# Patient Record
Sex: Female | Born: 1935 | Race: White | Hispanic: No | Smoking: Former smoker
Health system: Southern US, Community
[De-identification: ages and names within clinical notes are randomized; demographics above are authoritative.]

## PROBLEM LIST (undated history)

## (undated) DIAGNOSIS — I10 Essential (primary) hypertension: Secondary | ICD-10-CM

## (undated) DIAGNOSIS — M479 Spondylosis, unspecified: Secondary | ICD-10-CM

## (undated) DIAGNOSIS — R9431 Abnormal electrocardiogram [ECG] [EKG]: Secondary | ICD-10-CM

## (undated) DIAGNOSIS — R233 Spontaneous ecchymoses: Secondary | ICD-10-CM

## (undated) DIAGNOSIS — M810 Age-related osteoporosis without current pathological fracture: Secondary | ICD-10-CM

## (undated) DIAGNOSIS — C50919 Malignant neoplasm of unspecified site of unspecified female breast: Secondary | ICD-10-CM

## (undated) DIAGNOSIS — Z139 Encounter for screening, unspecified: Secondary | ICD-10-CM

## (undated) DIAGNOSIS — R079 Chest pain, unspecified: Secondary | ICD-10-CM

## (undated) DIAGNOSIS — E119 Type 2 diabetes mellitus without complications: Secondary | ICD-10-CM

## (undated) DIAGNOSIS — Z87891 Personal history of nicotine dependence: Secondary | ICD-10-CM

## (undated) DIAGNOSIS — J3089 Other allergic rhinitis: Secondary | ICD-10-CM

## (undated) DIAGNOSIS — E785 Hyperlipidemia, unspecified: Secondary | ICD-10-CM

## (undated) DIAGNOSIS — M1990 Unspecified osteoarthritis, unspecified site: Secondary | ICD-10-CM

## (undated) DIAGNOSIS — F0390 Unspecified dementia without behavioral disturbance: Secondary | ICD-10-CM

## (undated) HISTORY — DX: Other allergic rhinitis: J30.89

## (undated) HISTORY — DX: Spondylosis, unspecified: M47.9

## (undated) HISTORY — DX: Unspecified osteoarthritis, unspecified site: M19.90

## (undated) HISTORY — DX: Spontaneous ecchymoses: R23.3

## (undated) HISTORY — DX: Malignant neoplasm of unspecified site of unspecified female breast: C50.919

## (undated) HISTORY — DX: Type 2 diabetes mellitus without complications: E11.9

## (undated) HISTORY — PX: NEPHRECTOMY: SHX65

## (undated) HISTORY — PX: BREAST BIOPSY: SHX20

## (undated) HISTORY — DX: Hyperlipidemia, unspecified: E78.5

## (undated) HISTORY — DX: Chest pain, unspecified: R07.9

## (undated) HISTORY — DX: Essential (primary) hypertension: I10

## (undated) HISTORY — PX: TOTAL ABDOMINAL HYSTERECTOMY: SHX209

## (undated) HISTORY — DX: Abnormal electrocardiogram (ECG) (EKG): R94.31

## (undated) HISTORY — DX: Personal history of nicotine dependence: Z87.891

## (undated) HISTORY — DX: Encounter for screening, unspecified: Z13.9

## (undated) HISTORY — DX: Age-related osteoporosis without current pathological fracture: M81.0

## (undated) HISTORY — PX: CATARACT EXTRACTION, BILATERAL: SHX1313

## (undated) HISTORY — PX: MASTECTOMY: SHX3

---

## 2004-11-23 ENCOUNTER — Ambulatory Visit: Payer: Self-pay | Admitting: Internal Medicine

## 2005-11-22 ENCOUNTER — Ambulatory Visit: Payer: Self-pay | Admitting: Internal Medicine

## 2006-06-10 ENCOUNTER — Emergency Department: Payer: Self-pay | Admitting: Emergency Medicine

## 2006-06-10 ENCOUNTER — Other Ambulatory Visit: Payer: Self-pay

## 2006-12-06 ENCOUNTER — Ambulatory Visit: Payer: Self-pay | Admitting: Family Medicine

## 2007-11-28 ENCOUNTER — Ambulatory Visit: Payer: Self-pay | Admitting: Nurse Practitioner

## 2009-05-06 ENCOUNTER — Ambulatory Visit: Payer: Self-pay | Admitting: Nurse Practitioner

## 2010-04-05 ENCOUNTER — Ambulatory Visit: Payer: Self-pay | Admitting: Family Medicine

## 2010-05-10 ENCOUNTER — Ambulatory Visit: Payer: Self-pay | Admitting: Family Medicine

## 2011-05-30 ENCOUNTER — Ambulatory Visit: Payer: Self-pay | Admitting: Family Medicine

## 2011-09-20 ENCOUNTER — Ambulatory Visit: Payer: Self-pay | Admitting: Unknown Physician Specialty

## 2012-05-27 ENCOUNTER — Emergency Department: Payer: Self-pay | Admitting: Emergency Medicine

## 2012-05-27 LAB — COMPREHENSIVE METABOLIC PANEL
BUN: 15 mg/dL (ref 7–18)
Calcium, Total: 9 mg/dL (ref 8.5–10.1)
Chloride: 110 mmol/L — ABNORMAL HIGH (ref 98–107)
Creatinine: 1.09 mg/dL (ref 0.60–1.30)
EGFR (Non-African Amer.): 49 — ABNORMAL LOW

## 2012-05-27 LAB — CBC
HGB: 14.3 g/dL (ref 12.0–16.0)
MCH: 30.6 pg (ref 26.0–34.0)
MCHC: 32.6 g/dL (ref 32.0–36.0)
MCV: 94 fL (ref 80–100)
RDW: 13.4 % (ref 11.5–14.5)
WBC: 6 10*3/uL (ref 3.6–11.0)

## 2012-05-27 LAB — TROPONIN I
Troponin-I: <0.02 ng/mL
Troponin-I: <0.02 ng/mL

## 2012-05-28 ENCOUNTER — Ambulatory Visit (INDEPENDENT_AMBULATORY_CARE_PROVIDER_SITE_OTHER): Payer: Medicare PPO | Admitting: Cardiovascular Disease

## 2012-05-28 ENCOUNTER — Encounter: Payer: Self-pay | Admitting: Cardiovascular Disease

## 2012-05-28 VITALS — BP 138/90 | HR 76 | Ht 61.0 in | Wt 116.5 lb

## 2012-05-28 DIAGNOSIS — R079 Chest pain, unspecified: Secondary | ICD-10-CM

## 2012-05-28 DIAGNOSIS — I10 Essential (primary) hypertension: Secondary | ICD-10-CM | POA: Insufficient documentation

## 2012-05-28 DIAGNOSIS — I739 Peripheral vascular disease, unspecified: Secondary | ICD-10-CM | POA: Insufficient documentation

## 2012-05-28 NOTE — Assessment & Plan Note (Signed)
She describes exertional back and thigh discomfort which could be due to her known history of degenerative disc disease. However, she is slightly diminished femoral pulses and thus I will obtain an ABI for evaluation.

## 2012-05-28 NOTE — Assessment & Plan Note (Signed)
Her blood pressure is slightly elevated but will monitor this during her followup.

## 2012-05-28 NOTE — Assessment & Plan Note (Signed)
Recurrent chest pain is worrisome for mild angina. It could be also triggered by stress and anxiety. Given her multiple risk factors for coronary artery disease, I recommend further evaluation with a pharmacologic nuclear stress test. The patient is not able to exercise on a treadmill due to chronic back pain with significant arthritis.

## 2012-05-28 NOTE — Patient Instructions (Addendum)
Your physician has requested that you have a lexiscan myoview. For further information please visit [email or web address] Please follow instruction sheet, as given.  Your physician has requested that you have an ankle brachial index (ABI). During this test an ultrasound and blood pressure cuff are used to evaluate the arteries that supply the arms and legs with blood. Allow thirty minutes for this exam. There are no restrictions or special instructions.  Follow up after tests.

## 2012-05-28 NOTE — Progress Notes (Signed)
Primary care physician: Dr. Otho Darner.   HPI  This is a pleasant 79 year old female who was referred by Dr. Mayford Knife from the emergency room at [facility] for evaluation of chest pain. The patient does not have any previous cardiac history. She has known history of hypertension, hyperlipidemia and possible diabetes but not on medication. She also suffers from chronic back pain with degenerative disc disease diagnosed on MRI. She has been under stress lately related to taking care of her sick sister who is 74 year old. She started experiencing substernal chest tightness radiating to her neck over the last week. This happens with physical activities and mental stress. It lasts for a few minutes. It worsened yesterday and was instructed to go to the emergency room by her primary care physician. Her cardiac enzymes were negative. ECG did not show any acute changes. Chest x-ray showed no acute abnormalities. She was discharged home for outpatient followup. She is feeling better today. She complains of significant back discomfort radiating down to her legs with walking. She is not a smoker. There is no family history of premature coronary artery disease.  No Known Allergies   No current outpatient prescriptions on file prior to visit.   No current facility-administered medications on file prior to visit.     Past Medical History  Diagnosis Date  . Hyperlipidemia   . Arthritis   . Degenerative joint disease of low back   . Spontaneous ecchymoses   . Allergic rhinitis due to other allergen   . Type II or unspecified type diabetes mellitus without mention of complication, not stated as uncontrolled   . Osteoporosis, unspecified   . Other and unspecified hyperlipidemia   . Malignant neoplasm of breast (female), unspecified site   . Hypertension      Past Surgical History  Procedure Laterality Date  . Nephrectomy    . Breast biopsy Right   . Total abdominal hysterectomy    . Cataract extraction,  bilateral       History reviewed. No pertinent family history.   History   Social History  . Marital Status: Widowed    Spouse Name: N/A    Number of Children: N/A  . Years of Education: N/A   Occupational History  . Not on file.   Social History Main Topics  . Smoking status: Former Smoker -- 0 years    Types: Cigarettes  . Smokeless tobacco: Not on file  . Alcohol Use: No  . Drug Use: No  . Sexually Active: Not on file   Other Topics Concern  . Not on file   Social History Narrative  . No narrative on file     ROS Constitutional: Negative for fever, chills, diaphoresis, activity change, appetite change and fatigue.  HENT: Negative for hearing loss, nosebleeds, congestion, sore throat, facial swelling, drooling, trouble swallowing, neck pain, voice change, sinus pressure and tinnitus.  Eyes: Negative for photophobia, pain, discharge and visual disturbance.  Respiratory: Negative for apnea, cough and wheezing.  Cardiovascular: Negative for  palpitations and leg swelling.  Gastrointestinal: Negative for nausea, vomiting, abdominal pain, diarrhea, constipation, blood in stool and abdominal distention.  Genitourinary: Negative for dysuria, urgency, frequency, hematuria and decreased urine volume.  Musculoskeletal: Negative for myalgias, back pain, joint swelling, arthralgias and gait problem.  Skin: Negative for color change, pallor, rash and wound.  Neurological: Negative for dizziness, tremors, seizures, syncope, speech difficulty, weakness, light-headedness, numbness and headaches.  Psychiatric/Behavioral: Negative for suicidal ideas, hallucinations, behavioral problems and agitation. The patient is  nervous/anxious.     PHYSICAL EXAM   BP 138/90  Pulse 76  Ht 5\' 1"  (1.549 m)  Wt 116 lb 8 oz (52.844 kg)  BMI 22.02 kg/m2 Constitutional: She is oriented to person, place, and time. She appears well-developed and well-nourished. No distress.  HENT: No nasal  discharge.  Head: Normocephalic and atraumatic.  Eyes: Pupils are equal and round. Right eye exhibits no discharge. Left eye exhibits no discharge.  Neck: Normal range of motion. Neck supple. No JVD present. No thyromegaly present.  Cardiovascular: Normal rate, regular rhythm, normal heart sounds. Exam reveals no gallop and no friction rub. No murmur heard.  Pulmonary/Chest: Effort normal and breath sounds normal. No stridor. No respiratory distress. She has no wheezes. She has no rales. She exhibits no tenderness.  Abdominal: Soft. Bowel sounds are normal. She exhibits no distension. There is no tenderness. There is no rebound and no guarding.  Musculoskeletal: Normal range of motion. She exhibits no edema and no tenderness.  Neurological: She is alert and oriented to person, place, and time. Coordination normal.  Skin: Skin is warm and dry. No rash noted. She is not diaphoretic. No erythema. No pallor.  Psychiatric: She has a normal mood and affect. Her behavior is normal. Judgment and thought content normal.  Vascular: Normal radial pulse bilaterally. Femoral pulses are slightly diminished bilaterally.    EKG: An EKG from yesterday was reviewed which showed normal sinus rhythm with low voltage. No significant ST or T wave changes.   ASSESSMENT AND PLAN

## 2012-06-03 ENCOUNTER — Ambulatory Visit: Payer: Self-pay | Admitting: Cardiovascular Disease

## 2012-06-03 DIAGNOSIS — R079 Chest pain, unspecified: Secondary | ICD-10-CM

## 2012-06-04 ENCOUNTER — Other Ambulatory Visit: Payer: Self-pay

## 2012-06-04 DIAGNOSIS — R079 Chest pain, unspecified: Secondary | ICD-10-CM

## 2012-06-05 ENCOUNTER — Encounter (INDEPENDENT_AMBULATORY_CARE_PROVIDER_SITE_OTHER): Payer: Medicare PPO

## 2012-06-05 ENCOUNTER — Telehealth: Payer: Self-pay

## 2012-06-05 DIAGNOSIS — I739 Peripheral vascular disease, unspecified: Secondary | ICD-10-CM

## 2012-06-05 NOTE — Progress Notes (Signed)
lmtcb

## 2012-06-05 NOTE — Telephone Encounter (Signed)
Pt was inquiring about her stress test results from Monday,she was in today for her ABI, states she is worried and doesn't want to wait until 5/13 to get the results, but she can if she needs to. Please call

## 2012-06-05 NOTE — Telephone Encounter (Signed)
Pt informed

## 2012-06-10 NOTE — Progress Notes (Signed)
Pt informed of normal ABI results.

## 2012-06-25 ENCOUNTER — Ambulatory Visit (INDEPENDENT_AMBULATORY_CARE_PROVIDER_SITE_OTHER): Payer: Medicare PPO | Admitting: Cardiovascular Disease

## 2012-06-25 VITALS — BP 113/71 | HR 106 | Ht 61.5 in | Wt 115.0 lb

## 2012-06-25 DIAGNOSIS — I739 Peripheral vascular disease, unspecified: Secondary | ICD-10-CM

## 2012-06-25 DIAGNOSIS — R079 Chest pain, unspecified: Secondary | ICD-10-CM

## 2012-06-25 NOTE — Patient Instructions (Addendum)
Your stress test and circulation test were normal.  Follow up as needed.

## 2012-06-25 NOTE — Assessment & Plan Note (Signed)
This seems to be due to have chronic back pain with neuropathy. ABI was normal.

## 2012-06-25 NOTE — Progress Notes (Signed)
Primary care physician: Dr. Otho Darner.   HPI  This is a pleasant 79 year old female who is here today for followup visit regarding chest pain. She has known history of hypertension, hyperlipidemia and possible diabetes but not on medication. She also suffers from chronic back pain with degenerative disc disease diagnosed on MRI. She has been under stress lately related to taking care of her sick sister who is 88 year old.  She was seen recently for atypical chest pain. Basic workup in the emergency room was unremarkable. She underwent a pharmacologic nuclear stress test which was normal. She also had an ABI done due to lower extremity discomfort. This also was normal. She reports no further chest pain.  No Known Allergies   Current Outpatient Prescriptions on File Prior to Visit  Medication Sig Dispense Refill  . Calcium Carbonate (CALTRATE 600 PO) Take by mouth daily.      Marland Kitchen lovastatin (MEVACOR) 40 MG tablet Take 40 mg by mouth at bedtime.      . metoprolol succinate (TOPROL-XL) 25 MG 24 hr tablet Take 25 mg by mouth daily.      Marland Kitchen omeprazole (PRILOSEC) 10 MG capsule Take 10 mg by mouth daily.       No current facility-administered medications on file prior to visit.     Past Medical History  Diagnosis Date  . Hyperlipidemia   . Arthritis   . Degenerative joint disease of low back   . Spontaneous ecchymoses   . Allergic rhinitis due to other allergen   . Type II or unspecified type diabetes mellitus without mention of complication, not stated as uncontrolled   . Osteoporosis, unspecified   . Other and unspecified hyperlipidemia   . Malignant neoplasm of breast (female), unspecified site   . Hypertension      Past Surgical History  Procedure Laterality Date  . Nephrectomy    . Breast biopsy Right   . Total abdominal hysterectomy    . Cataract extraction, bilateral       No family history on file.   History   Social History  . Marital Status: Widowed    Spouse  Name: N/A    Number of Children: N/A  . Years of Education: N/A   Occupational History  . Not on file.   Social History Main Topics  . Smoking status: Former Smoker -- 0 years    Types: Cigarettes  . Smokeless tobacco: Not on file  . Alcohol Use: No  . Drug Use: No  . Sexually Active: Not on file   Other Topics Concern  . Not on file   Social History Narrative  . No narrative on file    PHYSICAL EXAM   BP 113/71  Pulse 106  Ht 5' 1.5" (1.562 m)  Wt 115 lb (52.164 kg)  BMI 21.38 kg/m2 Constitutional: She is oriented to person, place, and time. She appears well-developed and well-nourished. No distress.  HENT: No nasal discharge.  Head: Normocephalic and atraumatic.  Eyes: Pupils are equal and round. Right eye exhibits no discharge. Left eye exhibits no discharge.  Neck: Normal range of motion. Neck supple. No JVD present. No thyromegaly present.  Cardiovascular: Normal rate, regular rhythm, normal heart sounds. Exam reveals no gallop and no friction rub. No murmur heard.  Pulmonary/Chest: Effort normal and breath sounds normal. No stridor. No respiratory distress. She has no wheezes. She has no rales. She exhibits no tenderness.  Abdominal: Soft. Bowel sounds are normal. She exhibits no distension. There is no tenderness.  There is no rebound and no guarding.  Musculoskeletal: Normal range of motion. She exhibits no edema and no tenderness.  Neurological: She is alert and oriented to person, place, and time. Coordination normal.  Skin: Skin is warm and dry. No rash noted. She is not diaphoretic. No erythema. No pallor.  Psychiatric: She has a normal mood and affect. Her behavior is normal. Judgment and thought content normal.  Vascular: Normal radial pulse bilaterally. Femoral pulses are slightly diminished bilaterally.     ASSESSMENT AND PLAN

## 2012-06-25 NOTE — Assessment & Plan Note (Signed)
She reports no further chest pain. Cardiac stress test was normal. This likely was due to stress and anxiety. No further cardiac workup at this time. Followup as needed.

## 2012-07-16 ENCOUNTER — Ambulatory Visit: Payer: Self-pay | Admitting: Internal Medicine

## 2013-10-06 ENCOUNTER — Ambulatory Visit: Payer: Self-pay

## 2013-10-06 ENCOUNTER — Observation Stay: Payer: Self-pay | Admitting: Internal Medicine

## 2013-10-06 LAB — BASIC METABOLIC PANEL
Anion Gap: 9 (ref 7–16)
BUN: 17 mg/dL (ref 7–18)
CHLORIDE: 109 mmol/L — AB (ref 98–107)
CO2: 26 mmol/L (ref 21–32)
Calcium, Total: 9.4 mg/dL (ref 8.5–10.1)
Creatinine: 1.31 mg/dL — ABNORMAL HIGH (ref 0.60–1.30)
GFR CALC AF AMER: 45 — AB
GFR CALC NON AF AMER: 39 — AB
GLUCOSE: 97 mg/dL (ref 65–99)
Osmolality: 288 (ref 275–301)
Potassium: 4.2 mmol/L (ref 3.5–5.1)
Sodium: 144 mmol/L (ref 136–145)

## 2013-10-06 LAB — CBC
HCT: 43.8 % (ref 35.0–47.0)
HGB: 13.9 g/dL (ref 12.0–16.0)
MCH: 30.3 pg (ref 26.0–34.0)
MCHC: 31.6 g/dL — ABNORMAL LOW (ref 32.0–36.0)
MCV: 96 fL (ref 80–100)
PLATELETS: 185 10*3/uL (ref 150–440)
RBC: 4.57 10*6/uL (ref 3.80–5.20)
RDW: 14.2 % (ref 11.5–14.5)
WBC: 7 10*3/uL (ref 3.6–11.0)

## 2013-10-06 LAB — CK-MB
CK-MB: 1.5 ng/mL (ref 0.5–3.6)
CK-MB: 1.9 ng/mL (ref 0.5–3.6)

## 2013-10-06 LAB — PROTIME-INR
INR: 0.9
PROTHROMBIN TIME: 12.3 s (ref 11.5–14.7)

## 2013-10-06 LAB — TROPONIN I

## 2013-10-06 LAB — D-DIMER(ARMC): D-Dimer: 379 ng/ml

## 2013-10-06 LAB — APTT: Activated PTT: 36.6 secs — ABNORMAL HIGH (ref 23.6–35.9)

## 2013-10-07 ENCOUNTER — Other Ambulatory Visit: Payer: Self-pay | Admitting: Nurse Practitioner

## 2013-10-07 ENCOUNTER — Encounter: Payer: Self-pay | Admitting: Nurse Practitioner

## 2013-10-07 DIAGNOSIS — R0789 Other chest pain: Secondary | ICD-10-CM

## 2013-10-07 LAB — COMPREHENSIVE METABOLIC PANEL
ALBUMIN: 3.3 g/dL — AB (ref 3.4–5.0)
ALK PHOS: 61 U/L
ALT: 15 U/L
Anion Gap: 7 (ref 7–16)
BUN: 13 mg/dL (ref 7–18)
Bilirubin,Total: 0.5 mg/dL (ref 0.2–1.0)
CALCIUM: 8.9 mg/dL (ref 8.5–10.1)
Chloride: 112 mmol/L — ABNORMAL HIGH (ref 98–107)
Co2: 26 mmol/L (ref 21–32)
Creatinine: 0.97 mg/dL (ref 0.60–1.30)
EGFR (African American): >60
EGFR (Non-African Amer.): 56 — ABNORMAL LOW
Glucose: 88 mg/dL (ref 65–99)
Osmolality: 288 (ref 275–301)
POTASSIUM: 3.9 mmol/L (ref 3.5–5.1)
SGOT(AST): 16 U/L (ref 15–37)
SODIUM: 145 mmol/L (ref 136–145)
TOTAL PROTEIN: 6.2 g/dL — AB (ref 6.4–8.2)

## 2013-10-07 LAB — CBC WITH DIFFERENTIAL/PLATELET
Basophil #: 0.1 10*3/uL (ref 0.0–0.1)
Basophil %: 0.9 %
Eosinophil #: 0.4 10*3/uL (ref 0.0–0.7)
Eosinophil %: 6.2 %
HCT: 40.7 % (ref 35.0–47.0)
HGB: 13.3 g/dL (ref 12.0–16.0)
Lymphocyte #: 1.9 10*3/uL (ref 1.0–3.6)
Lymphocyte %: 28.5 %
MCH: 31.3 pg (ref 26.0–34.0)
MCHC: 32.6 g/dL (ref 32.0–36.0)
MCV: 96 fL (ref 80–100)
MONO ABS: 0.6 x10 3/mm (ref 0.2–0.9)
Monocyte %: 8.6 %
NEUTROS PCT: 55.8 %
Neutrophil #: 3.8 10*3/uL (ref 1.4–6.5)
Platelet: 178 10*3/uL (ref 150–440)
RBC: 4.24 10*6/uL (ref 3.80–5.20)
RDW: 13.9 % (ref 11.5–14.5)
WBC: 6.8 10*3/uL (ref 3.6–11.0)

## 2013-10-07 LAB — CK-MB: CK-MB: 1.9 ng/mL (ref 0.5–3.6)

## 2013-10-07 LAB — TROPONIN I

## 2013-10-07 LAB — HEPARIN LEVEL (UNFRACTIONATED): ANTI-XA(UNFRACTIONATED): 0.47 [IU]/mL (ref 0.30–0.70)

## 2013-10-14 ENCOUNTER — Encounter: Payer: Self-pay | Admitting: Nurse Practitioner

## 2013-10-14 ENCOUNTER — Ambulatory Visit (INDEPENDENT_AMBULATORY_CARE_PROVIDER_SITE_OTHER): Payer: Medicare PPO | Admitting: Nurse Practitioner

## 2013-10-14 VITALS — BP 110/70 | HR 94 | Ht 62.0 in | Wt 114.0 lb

## 2013-10-14 DIAGNOSIS — I10 Essential (primary) hypertension: Secondary | ICD-10-CM

## 2013-10-14 DIAGNOSIS — R079 Chest pain, unspecified: Secondary | ICD-10-CM

## 2013-10-14 NOTE — Patient Instructions (Signed)
Your physician recommends that you schedule a follow-up appointment with Dr. Fletcher Anon as needed.

## 2013-10-14 NOTE — Progress Notes (Signed)
Patient Name: Sylvia Juarez Date of Encounter: 10/14/2013  Primary Care Provider:  Halina Maidens, MD Primary Cardiologist:  Jerilynn Mages. Fletcher Anon, MD   Patient Profile  80 y/o female that was recently admitted to [facility] with diaphoresis and atypical chest pain who presents for f/u.  Problem List   Past Medical History  Diagnosis Date  . Hyperlipidemia   . Arthritis   . Degenerative joint disease of low back     a. With chronic back pain.  Marland Kitchen Spontaneous ecchymoses   . Allergic rhinitis due to other allergen   . Type II or unspecified type diabetes mellitus without mention of complication, not stated as uncontrolled   . Osteoporosis, unspecified   . Malignant neoplasm of breast (female), unspecified site   . Hypertension   . Chest pain     a. 05/2012 Lexiscan MV: EF >65%, no ischemia/infarct.  . History of tobacco abuse   . Peripheral Vascular Screening     a. 05/2012 ABI: R: 1.1, L: 1.1;  b. 05/2012 Carotid U/S: no hemodynamically significant stenosis bilat.  . Abnormal ECG     a. 05/2012 TWI in III, V1-V2.   Past Surgical History  Procedure Laterality Date  . Nephrectomy    . Breast biopsy Right   . Total abdominal hysterectomy    . Cataract extraction, bilateral      Allergies  No Known Allergies  HPI  80 y/o female with the above problem list.  She was admitted to [facility] last week after developing chest pressure after eating bfast that morning.  Ss persisted for several hours prior to presentation and resolved spontaneously.  Despite prolonged duration of Ss, she r/o for MI.  ECG was notable for TWI in V1-V3 and also lead III, however review of older ECG's showed similar changes.  She had a neg MV in 05/2012 and in the absence of obj evidence of ischemia, she was felt to be safe for discharge.    Since discharge, she has not had any further chest pain.  She lives by herself and is very active @ home without recurrent symptoms or limitations.  She denies palpitations, dyspnea,  pnd, orthopnea, n, v, dizziness, syncope, edema, weight gain, or early satiety.   Home Medications  Prior to Admission medications   Medication Sig Start Date End Date Taking? Authorizing Provider  metoprolol succinate (TOPROL-XL) 25 MG 24 hr tablet Take 25 mg by mouth daily.   Yes Historical Provider, MD  Multiple Vitamins-Minerals (PRESERVISION/LUTEIN PO) Take by mouth daily.   Yes Historical Provider, MD    Review of Systems  Doing well as above.  She denies chest pain, palpitations, dyspnea, pnd, orthopnea, n, v, dizziness, syncope, edema, weight gain, or early satiety.  All other systems reviewed and are otherwise negative except as noted above.  Physical Exam  Blood pressure 110/70, pulse 94, height 5\' 2"  (1.575 m), weight 114 lb (51.71 kg).  General: Pleasant, NAD Psych: Normal affect. Neuro: Alert and oriented X 3. Moves all extremities spontaneously. HEENT: Normal  Neck: Supple without bruits or JVD. Lungs:  Resp regular and unlabored, CTA. Heart: RRR no s3, s4, or murmurs. Abdomen: Soft, non-tender, non-distended, BS + x 4.  Extremities: No clubbing, cyanosis or edema. DP/PT/Radials 2+ and equal bilaterally.  Accessory Clinical Findings  ECG - RSR, 94, no acute ST/T changes.  Assessment & Plan  1.  Midsternal Chest Pain:  Pt was admitted with midsternal/atypical chest pain w/o obj evidence of ischemia.  She has had no  recurrence of Ss and has returned to her prior level of activity.  We felt Ss were most likely GI in nature and have previously recommended PRN antacids.  She had a neg MV in 05/2012, and thus I will not pursue any additional ischemic testing @ this time.  She wished to f/u on a prn basis with Dr. Fletcher Anon in the future.  2.  HTN:  Stable on bb therapy.  3. Dispo:  F/U with Dr. Fletcher Anon prn.  Murray Hodgkins, NP 10/14/2013, 2:40 PM

## 2014-01-17 ENCOUNTER — Ambulatory Visit: Payer: Self-pay | Admitting: Internal Medicine

## 2014-06-06 NOTE — Discharge Summary (Signed)
PATIENT NAME:  Sylvia, Juarez MR#:  [id number] DATE OF BIRTH:  22-Jan-1933  DATE OF ADMISSION:  10/06/2013 DATE OF DISCHARGE:  10/07/2013  ADMISSION DIAGNOSIS: Chest pain.  DISCHARGE DIAGNOSES: 1. Chest pain, likely gastrointestinal in nature.  2. Hypertension.   CONSULTATIONS: Cardiology.   LABORATORIES AT DISCHARGE: White blood cells 6.8, hemoglobin 13.3, hematocrit 41, platelets are 178,000. Sodium 145, potassium 3.9, chloride 112, bicarbonate 26, BUN 13, creatinine 0.97, glucose is 88.   Troponins x 3 are negative.   HOSPITAL COURSE: A pleasant 81 year old female who presented on 08/24 with chest pain as well as feeling near syncope. For further details, please refer to the H and P.  1. Chest pain. The patient was admitted to telemetry where she had no abnormal rhythms. Her EKG showed T wave inversions in leads III, V1 to V3 which is similar to what was seen in April 2014. She had undergone a stress test in April 2014 which essentially was negative. Cardiology was consulted due to her midsternal chest pressure with diaphoresis. Her troponins were all negative. This was not felt to be a cardiac in nature. There is no objective evidence of ischemia. She will follow up with cardiology as an outpatient which at that point she can consider repeat stress test. She is very adamant about not obtaining a stress test or further cardiac workup at this time given her troponins are negative and no chest pain. Again thought that this is most likely gastroenterologic in nature.  2. Hypertension. The patient will continue on metoprolol. Her blood pressure was controlled in the hospital.   DISCHARGE MEDICATIONS:  1. Aspirin 81 mg daily.  2. Nitroglycerin sublingual p.r.n. chest pain.  3. PreserVision 1 tablet b.i.d.  4. Pseudoephedrine 30 mg b.i.d.  5. Metoprolol 25 mg daily.   DISCHARGE DIET: Low sodium diet.   DISCHARGE ACTIVITY: As tolerated.  DISCHARGE FOLLOWUP: The patient will follow up  in 1-2 weeks with Dr. Fletcher Anon.  The patient was stable for discharge.   TIME SPENT: Approximately 35 minutes.    ____________________________ Donell Beers. Benjie Karvonen, MD spm:lt D: 10/07/2013 14:16:28 ET T: 10/07/2013 21:59:01 ET JOB#: [id number]  cc: Briannia Laba P. Benjie Karvonen, MD, <Dictator> Muhammad A. Fletcher Anon, MD Donell Beers Stefany Starace MD ELECTRONICALLY SIGNED 10/08/2013 10:27

## 2014-06-06 NOTE — Consult Note (Signed)
General Aspect PCP:  Dr. Annie Paras. Primary Cardiologist:  M. Fletcher Anon, MD. _____________  81 y/o female with a h/o chest pain and abnl ECG (nl myoview 05/2012) who was admitted yesterday with recurrent chest pain. _____________  Past Medical History ??? Hyperlipidemia  ??? Arthritis  ??? Degenerative joint disease of low back    a. With chronic back pain. ??? Spontaneous ecchymoses  ??? Allergic rhinitis due to other allergen  ??? Type II or unspecified type diabetes mellitus without mention of complication, not stated as uncontrolled  ??? Osteoporosis, unspecified  ??? Malignant neoplasm of breast (female), unspecified site  ??? Hypertension  ??? Chest pain    a. 05/2012 Lexiscan MV: EF >65%, no ischemia/infarct. ??? History of tobacco abuse as teenager ??? Peripheral Vascular Screening    a. 05/2012 ABI: R: 1.1, L: 1.1;  b. 05/2012 Carotid U/S: no hemodynamically significant stenosis bilat. ??? Abnormal ECG    a. 05/2012 TWI in III, V1-V2. _____________   Present Illness 81 y/o female with the above problem list.   In 05/2012, she had an episode of c/p and was seen in the [facility] ED.  ECG showed TWI in III, V1-V2.  She ruled out and subsequently saw Dr. Fletcher Anon.  Stress testing was performed and was negative for ischemia.  She further underwent screening including carotid u/s and ABI's, both of which were nl.  Over the past year, she has done well.  She lives by herself in [place] and says that she is fairly active @ home w/o limitations.  She was in her USOH until yesterday, when she awoke diaphoretic w/o associated Ss. After getting up and out of bed, this resolved.  She ate breakfast and then shortly afterward, she noted 4/10 substernal chest pressure w/o associated Ss.  After about an hour or two, she drove to her PCPs office and from there was advised to present to urgent care.  There, ECG showed TWI in V1-V3 and III, and she was sent to the ED.  There, troponin was nl.  She says she  eventually became pain free after about 4 hrs total duration of Ss.  She was admitted and placed on heparin.  Despite prolonged chest pressure, troponins have remained normal.  She has been symptom free since admission and wishes to be discharged today.   Physical Exam:  GEN well developed, well nourished, Pleasant   HEENT hearing intact to voice, moist oral mucosa   NECK supple  No masses  No bruits/jvd.   RESP normal resp effort  clear BS   CARD Regular rate and rhythm  Normal, S1, S2  No murmur   ABD denies tenderness  soft  normal BS   LYMPH negative neck   EXTR negative cyanosis/clubbing, negative edema   SKIN normal to palpation   NEURO motor/sensory function intact   PSYCH alert, A+O to time, place, person, good insight   Review of Systems:  Subjective/Chief Complaint feels fine, diaphoresis yesterday AM, chest tightness after eating breakfast   General: No Complaints   Skin: No Complaints   ENT: No Complaints   Eyes: No Complaints   Neck: No Complaints   Respiratory: No Complaints   Cardiovascular: Chest pain or discomfort  resolved   Gastrointestinal: No Complaints   Genitourinary: No Complaints   Vascular: No Complaints   Musculoskeletal: No Complaints   Neurologic: No Complaints   Hematologic: No Complaints   Endocrine: No Complaints   Psychiatric: No Complaints   Review of Systems: All other systems  were reviewed and found to be negative   Medications/Allergies Reviewed Medications/Allergies reviewed   Family & Social History:  Family and Social History:  Family History Non-Contributory  Non-contributory for premature CAD.  She thinks that parents died of old age.   Social History negative tobacco, Smoked some as a teenager.  No etoh/drugs or recent tobacco.   Place of Living Home  Lives by herself in [place].       Hypercholesterolemia:    HTN:    rt kidney removal:    rt mastectomy:    appendectomy:    hysterectomy:           Admit Diagnosis:   UNSTABLE ANGINA PECTORIS: Onset Date: 07-Oct-2013, Status: Active, Description: UNSTABLE ANGINA PECTORIS  Home Medications: Medication Instructions Status  Nitrostat 0.4 mg sublingual tablet 1 tab(s) sublingual every 5 minutes, As Needed chest pain/pressure call 911 if pain persisits after 2 NTG Active  aspirin 81 mg oral delayed release tablet 1 tab(s) orally once a day Active  metoprolol 25 mg oral tablet, extended release 1 tab(s) orally once a day Active  pseudoephedrine 30 mg oral tablet 1 tab(s) orally 2 times a day Active  PreserVision AREDS 2 Antioxidant Multiple Vitamins and Minerals oral capsule 1 cap(s) orally 2 times a day Active   Lab Results:  Hepatic:  25-Aug-15 04:56   Bilirubin, Total 0.5  Alkaline Phosphatase 61 (46-116 NOTE: New Reference Range 09/02/13)  SGPT (ALT) 15 (14-63 NOTE: New Reference Range 09/02/13)  SGOT (AST) 16  Total Protein, Serum  6.2  Albumin, Serum  3.3  Routine Chem:  25-Aug-15 04:56   Glucose, Serum 88  BUN 13  Creatinine (comp) 0.97  Sodium, Serum 145  Potassium, Serum 3.9  Chloride, Serum  112  CO2, Serum 26  Calcium (Total), Serum 8.9  Osmolality (calc) 288  eGFR (African American) >60  eGFR (Non-African American)  56 (eGFR values <26m/min/1.73 m2 may be an indication of chronic kidney disease (CKD). Calculated eGFR is useful in patients with stable renal function. The eGFR calculation will not be reliable in acutely ill patients when serum creatinine is changing rapidly. It is not useful in  patients on dialysis. The eGFR calculation may not be applicable to patients at the low and high extremes of body sizes, pregnant women, and vegetarians.)  Anion Gap 7  Cardiac:  24-Aug-15 16:08   Troponin I < 0.02 (0.00-0.05 0.05 ng/mL or less: NEGATIVE  Repeat testing in 3-6 hrs  if clinically indicated. >0.05 ng/mL: POTENTIAL  MYOCARDIAL INJURY. Repeat  testing in 3-6 hrs if  clinically  indicated. NOTE: An increase or decrease  of 30% or more on serial  testing suggests a  clinically important change)    20:27   Troponin I < 0.02 (0.00-0.05 0.05 ng/mL or less: NEGATIVE  Repeat testing in 3-6 hrs  if clinically indicated. >0.05 ng/mL: POTENTIAL  MYOCARDIAL INJURY. Repeat  testing in 3-6 hrs if  clinically indicated. NOTE: An increase or decrease  of 30% or more on serial  testing suggests a  clinically important change)  25-Aug-15 00:05   Troponin I < 0.02 (0.00-0.05 0.05 ng/mL or less: NEGATIVE  Repeat testing in 3-6 hrs  if clinically indicated. >0.05 ng/mL: POTENTIAL  MYOCARDIAL INJURY. Repeat  testing in 3-6 hrs if  clinically indicated. NOTE: An increase or decrease  of 30% or more on serial  testing suggests a  clinically important change)  Routine Hem:  25-Aug-15 04:56   WBC (CBC) 6.8  RBC (  CBC) 4.24  Hemoglobin (CBC) 13.3  Hematocrit (CBC) 40.7  Platelet Count (CBC) 178  MCV 96  MCH 31.3  MCHC 32.6  RDW 13.9  Neutrophil % 55.8  Lymphocyte % 28.5  Monocyte % 8.6  Eosinophil % 6.2  Basophil % 0.9  Neutrophil # 3.8  Lymphocyte # 1.9  Monocyte # 0.6  Eosinophil # 0.4  Basophil # 0.1 (Result(s) reported on 07 Oct 2013 at 05:31AM.)   EKG:  EKG Interp. by me   Interpretation EKG shows RSR, 98, TWI III, V1-V3 - similar to ECG in 05/2012, though TWI in V3 is new.   Radiology Results: XRay:    24-Aug-15 16:31, Chest 1 View AP or PA  Chest 1 View AP or PA   REASON FOR EXAM:    chest pain  COMMENTS:       PROCEDURE: DXR - DXR CHEST 1 VIEWAP OR PA  - Oct 06 2013  4:31PM     CLINICAL DATA:  Chest tightness    EXAM:  CHEST - 1 VIEW    COMPARISON:  05/27/2012    FINDINGS:  The heart size and mediastinal contours are within normal limits.  Both lungs are clear. The visualized skeletal structures are  unremarkable.     IMPRESSION:  No active disease.      Electronically Signed    By: Kathreen Devoid    On: 10/06/2013 16:43          Verified By: Norman Herrlich., MD    No Known Allergies:   Vital Signs/Nurse's Notes: **Vital Signs.:   25-Aug-15 03:46  Vital Signs Type Routine  Temperature Temperature (F) 97.3  Celsius 36.2  Temperature Source oral  Pulse Pulse 82  Respirations Respirations 18  Systolic BP Systolic BP 950  Diastolic BP (mmHg) Diastolic BP (mmHg) 72  Mean BP 90  Pulse Ox % Pulse Ox % 97  Pulse Ox Activity Level  At rest  Oxygen Delivery Room Air/ 21 %    Impression 1.  Misternal Chest Pressure:   diaphoresis yesterday AM, and subsequently developing mild chest pressure after eating breakfast.  There were no associated symptoms.   Despite prolonged symptoms (3-4 hrs), troponins have been wnl.   suspect GI in etiology ECG shows TWI in III, V1-V3, which is similar to what was seen in 05/2012.   --She has had no chest discomfort since admission and wishes to be discharged.  In the absence of objective evidence of ischemia, this is reasonable.   --She can f/u with Dr. Fletcher Anon following discharge @ which point we can consider repeat stress testing.   --Cont ASA, bb, prn nitrate. --suggested TUMS, zantac prn, coke/soda prn for sx relief   2.  HTN:  Stable. --Cont home meds.   Electronic Signatures: Rogelia Mire (NP)  (Signed 25-Aug-15 09:45)  Authored: General Aspect/Present Illness, History and Physical Exam, Review of System, Family & Social History, Home Medications, Labs, EKG , Radiology, Allergies, Vital Signs/Nurse's Notes, Impression/Plan Ida Rogue (MD)  (Signed 25-Aug-15 09:53)  Authored: General Aspect/Present Illness, History and Physical Exam, Review of System, Family & Social History, Past Medical History, Health Issues, Labs, EKG , Vital Signs/Nurse's Notes, Impression/Plan  Co-Signer: General Aspect/Present Illness, Home Medications, Allergies   Last Updated: 25-Aug-15 09:53 by Ida Rogue (MD)

## 2014-06-06 NOTE — H&P (Signed)
PATIENT NAME:  Sylvia Juarez, MESCH MR#:  [id number] DATE OF BIRTH:  1932-10-26  DATE OF ADMISSION:  10/06/2013  PRIMARY CARE PHYSICIAN: Halina Maidens, MD  HISTORY OF PRESENT ILLNESS: The patient is a 81 year old Caucasian female with past medical history significant for history of hypertension, hyperlipidemia, history of macular degeneration and negative Lexiscan stress test in April of 2014, who presents to the hospital with complaints of chest pains as well as feeling near-syncopal. When the patient woke up in the morning she was soaking wet, she got up and she felt presyncopal. She ate her breakfast and stayed in the living room in the chair. Suddenly while she was in the living room she started having pressure in the chest area and she did not feel right. She called her primary care physician, Dr. Army Melia, and came to her office. From there, she was sent to [facility] where she had an EKG and was eventually sent to the Emergency Room for admission. The patient admits of having midsternal chest pain, which was pressure-type pain, lasted at least 15 minutes or longer, was 5/10 by intensity, with no radiation. Initially for a few minutes it was constant, but then later it was intermittent pain with no recurrences. Accompanied by sweating, but no shortness of breath. On arrival to [facility], her EKG showed new T inversion in V3, and hospitalist services were contacted for admission.   PAST MEDICAL HISTORY: Significant for history of hypertension, hyperlipidemia, macular degeneration, status post Lexiscan in April 2014, which was unremarkable, read by Dr. Fletcher Anon.  PAST SURGICAL HISTORY: Breast carcinoma?, status post lumpectomy and chemotherapy 40 years ago with a hysterectomy and right kidney removal.   ALLERGIES: NONE.   FAMILY HISTORY: Negative for early coronary artery disease, hypertension, diabetes, cancers.   SOCIAL HISTORY: The patient is widowed since 15. Lives alone. She  never smoked or drank alcohol. 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  REVIEW OF SYSTEMS:  CONSTITUTIONAL: Positive for feeling fatigued and weak, presyncopal earlier today. Pains in the chest with some cough with whitish phlegm production, some nasal drainage. Otherwise, denies any fevers. Admit to feeling warm earlier today. Denies any weight loss or gain. EYES: Denies any blurry vision, double vision, glaucoma or cataracts.  EARS, NOSE, THROAT: Denies any tinnitus, allergies, epistaxis, sinus pain, dentures, difficulty swallowing. RESPIRATORY: Denies any wheezes, asthma, COPD.  CARDIOVASCULAR: Positive, admits of chest pain. Denies orthopnea, edema, arrhythmias, palpitations. Felt presyncopal. GASTROINTESTINAL: Denies any nausea, vomiting, constipation. GENITOURINARY: Denies hematuria, dysuria, frequency, incontinence.  ENDOCRINOLOGY: Denies any polydipsia, nocturia, thyroid disorders, heat or cold intolerance or thirst. HEMATOLOGIC: Denies anemia, easy bruising or bleeding, swollen glands.  SKIN: Denies any acne, rashes, change in moles.  MUSCULOSKELETAL: Denies arthritis, cramps, swelling. NEUROLOGIC: Denies numbness, epilepsy or tremor. PSYCHIATRIC: Denies anxiety, insomnia or depression.  PHYSICAL EXAMINATION:  VITAL SIGNS: On arrival to the hospital, temperature was 98.7, pulse was 108, respiratory rate was 20, blood pressure 139/95, saturation was 100% on room air.  GENERAL: This is a well-developed, well-nourished Caucasian female in no significant distress, comfortable on stretcher. HEENT: Her pupils are equal, reactive to light. Extraocular muscles intact, no icterus or conjunctivitis. Has normal hearing. No pharyngeal erythema. Mucosa is moist.  NECK: Supple, nontender. Thyroid not enlarged. No adenopathy. No JVD or carotid bruits bilaterally. Full range of motion.  LUNGS: Clear to auscultation in all fields. No rales, rhonchi,  diminished breath sounds or wheezing. No labored inspiration, increased effort,  dullness to percussion or overt respiratory distress.  CARDIOVASCULAR: S1, S2 appreciated. Rhythm was regular. PMI not lateralized. Chest is nontender to palpation. Pedal pulses 1+. No lower extremity edema, calf tenderness or cyanosis was noted.  ABDOMEN: Soft, nontender. Bowel sounds are present. No hepatosplenomegaly or masses were noted. RECTAL: Deferred. MUSCLE STRENGTH: Able to move all extremities. No cyanosis, degenerative joint disease or kyphosis. Gait was not tested.  SKIN: Did not reveal any rashes, lesions, erythema, nodularity, induration. It was warm and dry to palpation.  LYMPHATIC: No adenopathy in the cervical region.  NEUROLOGICAL: Cranial nerves grossly intact. Sensory is intact. No dysarthria or aphasia. The patient is alert, oriented to time, person and place, cooperative. The patient's memory is impaired. PSYCHIATRIC: No significant confusion, agitation or depression noted.  LABORATORY DATA: BMP showed creatinine of 1.31, otherwise BMP was unremarkable. The patient's troponin less than 0.02. White blood cell count is normal at 7.0, hemoglobin was 13.9, platelet count 185,000.   EKG: First EKG is done in [facility] at around 1400 hours 17 minutes, showed normal sinus rhythm at 98 beats per minute, normal axis, T-wave abnormality, consider anterior ischemia with T depressions in V2, V3, which improved by the time patient arrived to the Emergency Room and EKG was done at around 4:00 p.m.   RADIOLOGIC STUDIES: One view x-ray showed no active disease.   ASSESSMENT AND PLAN:  1.  Unstable angina with EKG changes. Admit the patient to medical floor, start her on metoprolol, aspirin, nitroglycerin as well as heparin IV. We will get cardiology consultation and cardiologist is to determine if the patient would benefit from stress test or cardiac catheterization and statin. 2.  Renal insufficiency.  We will continue the patient on IV fluids for now. Follow creatinine in the morning.  3.  Hyperlipidemia. Continue statin. Get lipid panel in the morning.  4.  Hypertension. Continue metoprolol as well as nitroglycerin and follow blood pressure readings.   TIME SPENT: Fifty minutes.    ____________________________ Theodoro Grist, MD rv:TT D: 10/06/2013 18:27:06 ET T: 10/06/2013 20:06:03 ET JOB#: [id number]  cc: Theodoro Grist, MD, <Dictator> Halina Maidens, MD [place] MD ELECTRONICALLY SIGNED 11/10/2013 20:59

## 2014-08-31 ENCOUNTER — Ambulatory Visit
Admission: EM | Admit: 2014-08-31 | Discharge: 2014-08-31 | Disposition: A | Discharge status: Home / Self Care | Payer: Medicare PPO | Attending: Family Medicine | Admitting: Family Medicine

## 2014-08-31 ENCOUNTER — Encounter: Payer: Self-pay | Admitting: *Deleted

## 2014-08-31 DIAGNOSIS — L03114 Cellulitis of left upper limb: Secondary | ICD-10-CM

## 2014-08-31 MED ORDER — CEPHALEXIN 500 MG PO CAPS: 500.0000 mg | ORAL_CAPSULE | Freq: Two times a day (BID) | ORAL | Status: DC

## 2014-08-31 NOTE — ED Notes (Signed)
Pt states "just woke up Friday or Saturday morning from the bed and it was there, dont know what caused it, does not itch"

## 2014-08-31 NOTE — ED Provider Notes (Signed)
CSN: [id number]     Arrival date & time 08/31/14  1741 History   First MD Initiated Contact with Patient 08/31/14 1844     Chief Complaint  Patient presents with  . Rash   (Consider location/radiation/quality/duration/timing/severity/associated sxs/prior Treatment) HPI Comments: 81 yo female with a 3 days h/o left forearm rash and slight discomfort. States woke up 3 days ago with redness to the area. Denies any injuries, falls. Not sure why woke up with the redness. States seems to be spreading slightly. Denies any fevers or chills.   The history is provided by the patient.    Past Medical History  Diagnosis Date  . Hyperlipidemia   . Arthritis   . Degenerative joint disease of low back     a. With chronic back pain.  Marland Kitchen Spontaneous ecchymoses   . Allergic rhinitis due to other allergen   . Type II or unspecified type diabetes mellitus without mention of complication, not stated as uncontrolled   . Osteoporosis, unspecified   . Malignant neoplasm of breast (female), unspecified site   . Hypertension   . Chest pain     a. 05/2012 Lexiscan MV: EF >65%, no ischemia/infarct.  . History of tobacco abuse   . Peripheral Vascular Screening     a. 05/2012 ABI: R: 1.1, L: 1.1;  b. 05/2012 Carotid U/S: no hemodynamically significant stenosis bilat.  . Abnormal ECG     a. 05/2012 TWI in III, V1-V2.   Past Surgical History  Procedure Laterality Date  . Nephrectomy    . Breast biopsy Right   . Total abdominal hysterectomy    . Cataract extraction, bilateral     Family History  Problem Relation Age of Onset  . Family history unknown: Yes   History  Substance Use Topics  . Smoking status: Former Smoker -- 0 years    Types: Cigarettes  . Smokeless tobacco: Not on file  . Alcohol Use: No   OB History    No data available     Review of Systems  Allergies  Review of patient's allergies indicates no known allergies.  Home Medications   Prior to Admission medications   Medication  Sig Start Date End Date Taking? Authorizing Provider  cephALEXin (KEFLEX) 500 MG capsule Take 1 capsule (500 mg total) by mouth 2 (two) times daily. 08/31/14   Norval Gable, MD  metoprolol succinate (TOPROL-XL) 25 MG 24 hr tablet Take 25 mg by mouth daily.    Historical Provider, MD  Multiple Vitamins-Minerals (PRESERVISION/LUTEIN PO) Take by mouth daily.    Historical Provider, MD   BP 146/82 mmHg  Pulse 93  Temp(Src) 98 F (36.7 C) (Oral)  Ht 5\' 2"  (1.575 m)  Wt 99 lb (44.906 kg)  BMI 18.10 kg/m2  SpO2 98% Physical Exam  Constitutional: She appears well-developed and well-nourished. No distress.  Skin: Rash noted. She is not diaphoretic.  3x4 cm skin area with blanchable erythema, warmth and tenderness to palpation on the left forearm; extremity neurovascularly intact  Nursing note and vitals reviewed.   ED Course  Procedures (including critical care time) Labs Review Labs Reviewed - No data to display  Imaging Review No results found.   MDM   1. Cellulitis of left upper extremity   (left forearm area; mild)    Discharge Medication List as of 08/31/2014  6:55 PM    START taking these medications   Details  cephALEXin (KEFLEX) 500 MG capsule Take 1 capsule (500 mg total) by mouth 2 (  two) times daily., Starting 08/31/2014, Until Discontinued, Normal      Plan: 1. diagnosis reviewed with patient 2. rx as per orders; risks, benefits, potential side effects reviewed with patient 3. Recommend supportive treatment with warm compresses to area 4. F/u prn if symptoms worsen or don't improve    Norval Gable, MD 08/31/14 [phone]

## 2014-09-02 ENCOUNTER — Encounter: Payer: Self-pay | Admitting: Gynecology

## 2014-09-02 ENCOUNTER — Ambulatory Visit
Admission: EM | Admit: 2014-09-02 | Discharge: 2014-09-02 | Disposition: A | Discharge status: Home / Self Care | Payer: Medicare PPO | Attending: Family Medicine | Admitting: Family Medicine

## 2014-09-02 DIAGNOSIS — T148XXA Other injury of unspecified body region, initial encounter: Secondary | ICD-10-CM

## 2014-09-02 DIAGNOSIS — T148 Other injury of unspecified body region: Secondary | ICD-10-CM

## 2014-09-02 NOTE — ED Provider Notes (Signed)
Patient presents today with history of hitting her right shin on a wood door about an hour ago. Patient states that she has a blood blister at the site now. She denies any bleeding from the site prior to coming in today. She denies any pain of the site. She states that she is walking normally. She denies being on any blood thinners.  ROS: Negative except mentioned above.  GENERAL: NAD EXTREMITIES: R LE-quarter sized hematoma on right shin, no tenderness of the site, no acute bleeding from the site, normal gait for patient  A/P: R LE Hematoma-encourage patient to avoid taking the skin off of the site, monitor the site for any signs of infection, ice on the area if any pain, if any further problems patient will follow-up here or with her primary care physician.  Paulina Fusi, MD 09/02/14 [phone]

## 2014-09-02 NOTE — ED Notes (Signed)
Patient stated hit right lower leg in door way x today. Patient stated leg not painful, but has a red blistered.

## 2014-12-02 ENCOUNTER — Encounter: Payer: Self-pay | Admitting: Internal Medicine

## 2014-12-04 ENCOUNTER — Encounter: Payer: Self-pay | Admitting: Internal Medicine

## 2014-12-04 ENCOUNTER — Other Ambulatory Visit: Payer: Self-pay | Admitting: Internal Medicine

## 2014-12-04 DIAGNOSIS — M810 Age-related osteoporosis without current pathological fracture: Secondary | ICD-10-CM | POA: Insufficient documentation

## 2014-12-04 DIAGNOSIS — Z8711 Personal history of peptic ulcer disease: Secondary | ICD-10-CM | POA: Insufficient documentation

## 2014-12-04 DIAGNOSIS — H353190 Nonexudative age-related macular degeneration, unspecified eye, stage unspecified: Secondary | ICD-10-CM | POA: Insufficient documentation

## 2014-12-04 DIAGNOSIS — T7840XA Allergy, unspecified, initial encounter: Secondary | ICD-10-CM | POA: Insufficient documentation

## 2014-12-04 DIAGNOSIS — I10 Essential (primary) hypertension: Secondary | ICD-10-CM | POA: Insufficient documentation

## 2014-12-04 DIAGNOSIS — Z853 Personal history of malignant neoplasm of breast: Secondary | ICD-10-CM | POA: Insufficient documentation

## 2014-12-04 DIAGNOSIS — E785 Hyperlipidemia, unspecified: Secondary | ICD-10-CM | POA: Insufficient documentation

## 2014-12-04 DIAGNOSIS — M47817 Spondylosis without myelopathy or radiculopathy, lumbosacral region: Secondary | ICD-10-CM | POA: Insufficient documentation

## 2014-12-04 DIAGNOSIS — R413 Other amnesia: Secondary | ICD-10-CM | POA: Insufficient documentation

## 2015-01-29 ENCOUNTER — Encounter: Payer: Self-pay | Admitting: Internal Medicine

## 2015-02-19 ENCOUNTER — Encounter: Payer: Self-pay | Admitting: Internal Medicine

## 2015-02-19 ENCOUNTER — Ambulatory Visit (INDEPENDENT_AMBULATORY_CARE_PROVIDER_SITE_OTHER): Payer: Medicare PPO | Admitting: Internal Medicine

## 2015-02-19 VITALS — BP 138/78 | HR 90 | Ht 60.0 in | Wt 99.6 lb

## 2015-02-19 DIAGNOSIS — M545 Low back pain, unspecified: Secondary | ICD-10-CM

## 2015-02-19 DIAGNOSIS — M810 Age-related osteoporosis without current pathological fracture: Secondary | ICD-10-CM

## 2015-02-19 DIAGNOSIS — R413 Other amnesia: Secondary | ICD-10-CM

## 2015-02-19 DIAGNOSIS — I10 Essential (primary) hypertension: Secondary | ICD-10-CM | POA: Diagnosis not present

## 2015-02-19 NOTE — Progress Notes (Signed)
Date:  02/19/2015   Name:  Sylvia Juarez   DOB:  1932-05-25   MRN:  [id number]   Chief Complaint: Urinary Tract Infection Patient is complaining of some mid back pain. Her son is also noted that she's been a little more confused recently he wants to exclude the possibility of a urinary tract infection. She denies any burning or stinging. The son states that her appetite and sleep are stable that she does not appear to be in any distress. When she has back pain he has a topical rub that she uses with good results.  Memory disorder - the son states that she is in a supportive environment. She has a friend who sits with her all day long. At night she is alone but tends to sleep well and has had no issues. He and other family are considering getting someone to stay with her at night as well. She's no longer driving. He manages her medications. Her weight has been stable. No other concerns are voiced by him.   Review of Systems  Constitutional: Negative for appetite change and unexpected weight change.  Eyes: Negative for visual disturbance.  Respiratory: Negative for cough and wheezing.   Cardiovascular: Negative for chest pain and leg swelling.  Gastrointestinal: Negative for abdominal pain.  Genitourinary: Negative for dysuria and urgency.  Musculoskeletal: Positive for back pain. Negative for arthralgias.  Skin: Negative for color change and rash.  Neurological: Negative for tremors, syncope, weakness, numbness and headaches.  Psychiatric/Behavioral: Positive for confusion. Negative for behavioral problems, sleep disturbance and agitation. The patient is not nervous/anxious.     Patient Active Problem List   Diagnosis Date Noted  . Nonexudative age-related macular degeneration 12/04/2014  . Benign essential HTN 12/04/2014  . History of malignant neoplasm of female breast 12/04/2014  . Lumbosacral spondylosis 12/04/2014  . Dyslipidemia 12/04/2014  . Allergic state 12/04/2014    . H/O peptic ulcer 12/04/2014  . Bad memory 12/04/2014  . OP (osteoporosis) 12/04/2014  . Chest pain 05/28/2012  . Claudication ([place]) 05/28/2012  . Hypertension     Prior to Admission medications   Medication Sig Start Date End Date Taking? Authorizing Provider  Multiple Vitamins-Minerals (PRESERVISION/LUTEIN PO) Take by mouth daily.   Yes Historical Provider, MD  metoprolol succinate (TOPROL-XL) 25 MG 24 hr tablet Take 25 mg by mouth daily. Reported on 02/19/2015    Historical Provider, MD    Allergies  Allergen Reactions  . Prednisone Other (See Comments)    Past Surgical History  Procedure Laterality Date  . Nephrectomy    . Breast biopsy Right   . Total abdominal hysterectomy    . Cataract extraction, bilateral      Social History  Substance Use Topics  . Smoking status: Former Smoker -- 0 years    Types: Cigarettes  . Smokeless tobacco: None  . Alcohol Use: No     Medication list has been reviewed and updated.   Physical Exam  Constitutional: She appears well-developed. No distress.  HENT:  Head: Normocephalic and atraumatic.  Eyes: Conjunctivae are normal. Right eye exhibits no discharge. Left eye exhibits no discharge. No scleral icterus.  Neck: Normal range of motion. Neck supple. No thyromegaly present.  Cardiovascular: Normal rate, regular rhythm and normal heart sounds.   Pulmonary/Chest: Effort normal and breath sounds normal. No respiratory distress. She has no wheezes.  Abdominal: Soft. Bowel sounds are normal. There is no tenderness. There is no guarding.  Musculoskeletal: Normal range of motion.  She exhibits no edema or tenderness.  Neurological: She is alert. She has normal strength. She is disoriented.  Skin: Skin is warm and dry. No rash noted.  Psychiatric: She has a normal mood and affect. Her speech is normal and behavior is normal. Cognition and memory are impaired.  Nursing note and vitals reviewed.   BP 138/78 mmHg  Pulse 90  Ht 5'  (1.524 m)  Wt 99 lb 9.6 oz (45.178 kg)  BMI 19.45 kg/m2  Assessment and Plan: 1. Bad memory Patient has good family support in a safe environment Son has healthcare power of attorney and is the contact person  2. Benign essential HTN controlled - CBC with Differential/Platelet - Comprehensive metabolic panel - TSH  3. OP (osteoporosis) asymptomatic  4. Midline low back pain without sciatica No evidence of urinary tract infection Recommend topical rubs or Tylenol as needed for back pain - POCT urinalysis dipstick   Halina Maidens, MD [facility]  02/19/2015

## 2015-02-20 LAB — COMPREHENSIVE METABOLIC PANEL
ALK PHOS: 67 IU/L (ref 39–117)
ALT: 16 IU/L (ref 0–32)
AST: 19 IU/L (ref 0–40)
Albumin/Globulin Ratio: 1.9 (ref 1.1–2.5)
Albumin: 4.2 g/dL (ref 3.5–4.7)
BILIRUBIN TOTAL: 0.4 mg/dL (ref 0.0–1.2)
BUN/Creatinine Ratio: 12 (ref 11–26)
BUN: 16 mg/dL (ref 8–27)
CHLORIDE: 104 mmol/L (ref 96–106)
CO2: 25 mmol/L (ref 18–29)
CREATININE: 1.33 mg/dL — AB (ref 0.57–1.00)
Calcium: 9.7 mg/dL (ref 8.7–10.3)
GFR calc Af Amer: 44 mL/min/{1.73_m2} — ABNORMAL LOW (ref >59)
GFR calc non Af Amer: 38 mL/min/{1.73_m2} — ABNORMAL LOW (ref >59)
Globulin, Total: 2.2 g/dL (ref 1.5–4.5)
Glucose: 78 mg/dL (ref 65–99)
Potassium: 3.8 mmol/L (ref 3.5–5.2)
Sodium: 146 mmol/L — ABNORMAL HIGH (ref 134–144)
Total Protein: 6.4 g/dL (ref 6.0–8.5)

## 2015-02-20 LAB — CBC WITH DIFFERENTIAL/PLATELET
BASOS ABS: 0 10*3/uL (ref 0.0–0.2)
Basos: 1 %
EOS (ABSOLUTE): 0.3 10*3/uL (ref 0.0–0.4)
Eos: 5 %
Hematocrit: 41.1 % (ref 34.0–46.6)
Hemoglobin: 13.2 g/dL (ref 11.1–15.9)
Immature Grans (Abs): 0 10*3/uL (ref 0.0–0.1)
Immature Granulocytes: 0 %
LYMPHS ABS: 1.6 10*3/uL (ref 0.7–3.1)
Lymphs: 24 %
MCH: 30.3 pg (ref 26.6–33.0)
MCHC: 32.1 g/dL (ref 31.5–35.7)
MCV: 94 fL (ref 79–97)
MONOCYTES: 7 %
Monocytes Absolute: 0.5 10*3/uL (ref 0.1–0.9)
NEUTROS PCT: 63 %
Neutrophils Absolute: 4 10*3/uL (ref 1.4–7.0)
PLATELETS: 237 10*3/uL (ref 150–379)
RBC: 4.36 x10E6/uL (ref 3.77–5.28)
RDW: 14.5 % (ref 12.3–15.4)
WBC: 6.3 10*3/uL (ref 3.4–10.8)

## 2015-02-20 LAB — TSH: TSH: 2.24 u[IU]/mL (ref 0.450–4.500)

## 2015-02-23 ENCOUNTER — Telehealth: Payer: Self-pay

## 2015-02-23 NOTE — Telephone Encounter (Signed)
Spoke with patient. Patient advised and verbalized understanding. [facility]

## 2015-02-23 NOTE — Telephone Encounter (Signed)
-----   Message from Glean Hess, MD sent at 02/20/2015  2:55 PM EST ----- Labs are normal except mild decrease in kidney function vs insufficient fluid intake.  Try to increase fluids overall. (MH - please enter results of UA from [facility] [place]).

## 2015-03-02 ENCOUNTER — Other Ambulatory Visit (INDEPENDENT_AMBULATORY_CARE_PROVIDER_SITE_OTHER): Payer: Medicare PPO

## 2015-03-02 DIAGNOSIS — Z111 Encounter for screening for respiratory tuberculosis: Secondary | ICD-10-CM | POA: Diagnosis not present

## 2015-03-02 NOTE — Progress Notes (Signed)
Patient ID: Sylvia Juarez, female   DOB: December 09, 1932, 82 y.o.   MRN: [id number]   Patient presents for a TB skin test.  She is moving to [place] in [place], [place]. An FL-2 is completed and the results of the PPD will be sent along with the form to the ALF when available. Patient will return between 48 and 72 hours from now for PPD to be read.

## 2015-03-05 LAB — TB SKIN TEST
Induration: 0 mm
TB SKIN TEST: NEGATIVE

## 2017-05-11 ENCOUNTER — Emergency Department
Admission: EM | Admit: 2017-05-11 | Discharge: 2017-05-11 | Disposition: A | Discharge status: Skilled Nursing Facility | Payer: Medicare Other | Attending: Emergency Medicine | Admitting: Emergency Medicine

## 2017-05-11 ENCOUNTER — Other Ambulatory Visit: Payer: Self-pay

## 2017-05-11 ENCOUNTER — Emergency Department: Payer: Medicare Other

## 2017-05-11 DIAGNOSIS — Z79899 Other long term (current) drug therapy: Secondary | ICD-10-CM | POA: Insufficient documentation

## 2017-05-11 DIAGNOSIS — I10 Essential (primary) hypertension: Secondary | ICD-10-CM | POA: Insufficient documentation

## 2017-05-11 DIAGNOSIS — S0990XA Unspecified injury of head, initial encounter: Secondary | ICD-10-CM | POA: Insufficient documentation

## 2017-05-11 DIAGNOSIS — Y999 Unspecified external cause status: Secondary | ICD-10-CM | POA: Insufficient documentation

## 2017-05-11 DIAGNOSIS — Y939 Activity, unspecified: Secondary | ICD-10-CM | POA: Insufficient documentation

## 2017-05-11 DIAGNOSIS — W19XXXA Unspecified fall, initial encounter: Secondary | ICD-10-CM | POA: Insufficient documentation

## 2017-05-11 DIAGNOSIS — Z87891 Personal history of nicotine dependence: Secondary | ICD-10-CM | POA: Insufficient documentation

## 2017-05-11 DIAGNOSIS — Y929 Unspecified place or not applicable: Secondary | ICD-10-CM | POA: Diagnosis not present

## 2017-05-11 HISTORY — DX: Unspecified dementia, unspecified severity, without behavioral disturbance, psychotic disturbance, mood disturbance, and anxiety: F03.90

## 2017-05-11 NOTE — ED Notes (Signed)
Son in hallway with other family member. Son able to assist pt into wheelchair and taken to room 26 with [facility] ED tech.

## 2017-05-11 NOTE — ED Notes (Signed)
Pt remains standing in room. Walking around room holding on to bed and other furniture in room. Pt remains uncooperative to sitting or lying down. Pt remains refusing to let this RN or other RN's clean pt of blood. Officer and [place] ED tech at bedside to assist.

## 2017-05-11 NOTE — ED Notes (Signed)
Pt ambulating down hallway. Attempt to ambulate pt to room closer to nursing station. Pt ambulating the wrong way down hallway. Staff remains with pt. Son on the way to ED.
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
## 2017-05-11 NOTE — ED Notes (Signed)
Dr. Kerman Passey and [facility] ED tech at bedside. FAmily at bedside. Patient is more calm and comfortable at this time with family present.

## 2017-05-11 NOTE — ED Notes (Signed)
This RN went to grab a warm blanket for pt. When returned to room pt was standing beside the bed with both side rails raised. Pt refused to get back into bed. Pt refused to let this RN assist her back into bed. Pt refusing to let this RN clean pt hands d/t presence of blood.

## 2017-05-11 NOTE — ED Triage Notes (Signed)
Pt arrives [facility] from [facility], hx dementia. Pt was found in hallway on floor. EMS reports 1.5 inch skin tear to forehead. Blood noted to face, bleeding controlled at this time. VSS with EMS. EMS reports pt would not keep anything on her, kept trying to pull bandage off head, kept unbuckling from stretcher.

## 2017-05-11 NOTE — ED Notes (Signed)
Pt cleaned up by MD Paduchowski, this tech and the assistance of son. Pt remains cooperative in wheelchair in ED room 26 with this tech and family at pt bedside.

## 2017-05-11 NOTE — ED Notes (Signed)
Pt family and this tech at pt bedside at this time, pt not cooperative with this tech or other ED staff, pt would allow son to clean off her hands and and pt is sitting calmly in wheelchair talking to son at this time, this tech will remain with pt until MD discharges pt.

## 2017-06-22 ENCOUNTER — Emergency Department: Payer: Medicare Other

## 2017-06-22 ENCOUNTER — Emergency Department
Admission: EM | Admit: 2017-06-22 | Discharge: 2017-06-22 | Disposition: A | Discharge status: Home / Self Care | Payer: Medicare Other | Attending: Emergency Medicine | Admitting: Emergency Medicine

## 2017-06-22 DIAGNOSIS — Z87891 Personal history of nicotine dependence: Secondary | ICD-10-CM | POA: Diagnosis not present

## 2017-06-22 DIAGNOSIS — I10 Essential (primary) hypertension: Secondary | ICD-10-CM | POA: Insufficient documentation

## 2017-06-22 DIAGNOSIS — W19XXXA Unspecified fall, initial encounter: Secondary | ICD-10-CM | POA: Diagnosis not present

## 2017-06-22 DIAGNOSIS — F0390 Unspecified dementia without behavioral disturbance: Secondary | ICD-10-CM | POA: Diagnosis not present

## 2017-06-22 DIAGNOSIS — E119 Type 2 diabetes mellitus without complications: Secondary | ICD-10-CM | POA: Insufficient documentation

## 2017-06-22 DIAGNOSIS — Y929 Unspecified place or not applicable: Secondary | ICD-10-CM | POA: Diagnosis not present

## 2017-06-22 DIAGNOSIS — S0003XA Contusion of scalp, initial encounter: Secondary | ICD-10-CM | POA: Insufficient documentation

## 2017-06-22 DIAGNOSIS — Y998 Other external cause status: Secondary | ICD-10-CM | POA: Diagnosis not present

## 2017-06-22 DIAGNOSIS — S0990XA Unspecified injury of head, initial encounter: Secondary | ICD-10-CM

## 2017-06-22 DIAGNOSIS — Z79899 Other long term (current) drug therapy: Secondary | ICD-10-CM | POA: Insufficient documentation

## 2017-06-22 DIAGNOSIS — Y939 Activity, unspecified: Secondary | ICD-10-CM | POA: Diagnosis not present

## 2017-06-22 NOTE — ED Notes (Signed)
Family at bedside. 

## 2017-06-22 NOTE — ED Notes (Signed)
Bed alarm, fall risk bracelet, and fall risk sign placed.

## 2017-06-22 NOTE — ED Notes (Signed)
ED Provider at bedside. 

## 2017-06-22 NOTE — ED Provider Notes (Signed)
[facility] Emergency Department Provider Note ____________________________________________   First MD Initiated Contact with Patient 06/22/17 2044     (approximate)  I have reviewed the triage vital signs and the nursing notes.   HISTORY  Chief Complaint Fall  Level 5 caveat: History of present illness limited by dementia  HPI Sylvia Juarez is a 84 y.o. female with PMH as noted below who presents with apparent head injury after an unwitnessed fall from standing height.  The patient denies any complaints and is unable to provide any history.  Past Medical History:  Diagnosis Date  . Abnormal ECG    a. 05/2012 TWI in III, V1-V2.  Marland Kitchen Allergic rhinitis due to other allergen   . Arthritis   . Chest pain    a. 05/2012 Lexiscan MV: EF >65%, no ischemia/infarct.  . Degenerative joint disease of low back    a. With chronic back pain.  . Dementia   . History of tobacco abuse   . Hyperlipidemia   . Hypertension   . Malignant neoplasm of breast (female), unspecified site   . Osteoporosis, unspecified   . Peripheral Vascular Screening    a. 05/2012 ABI: R: 1.1, L: 1.1;  b. 05/2012 Carotid U/S: no hemodynamically significant stenosis bilat.  Marland Kitchen Spontaneous ecchymoses   . Type II or unspecified type diabetes mellitus without mention of complication, not stated as uncontrolled     Patient Active Problem List   Diagnosis Date Noted  . Nonexudative age-related macular degeneration 12/04/2014  . Benign essential HTN 12/04/2014  . History of malignant neoplasm of female breast 12/04/2014  . Lumbosacral spondylosis 12/04/2014  . Dyslipidemia 12/04/2014  . Allergic state 12/04/2014  . H/O peptic ulcer 12/04/2014  . Bad memory 12/04/2014  . OP (osteoporosis) 12/04/2014  . Chest pain 05/28/2012  . Claudication ([place]) 05/28/2012    Past Surgical History:  Procedure Laterality Date  . BREAST BIOPSY Right   . CATARACT EXTRACTION, BILATERAL    .  NEPHRECTOMY    . TOTAL ABDOMINAL HYSTERECTOMY      Prior to Admission medications   Medication Sig Start Date End Date Taking? Authorizing Provider  cyanocobalamin 500 MCG tablet Take 500 mcg by mouth daily.    [provider]    Allergies Actonel [risedronate sodium] and Prednisone  Family History  Problem Relation Age of Onset  . Dementia Sister     Social History Social History   Tobacco Use  . Smoking status: Former Smoker    Years: 0.00    Types: Cigarettes  . Smokeless tobacco: Never Used  Substance Use Topics  . Alcohol use: No    Alcohol/week: 0.0 oz  . Drug use: No    Review of Systems Level 5 caveat: Unable to obtain review of systems due to dementia    ____________________________________________   PHYSICAL EXAM:  VITAL SIGNS: ED Triage Vitals  Enc Vitals Group     BP 06/22/17 2035 (!) 160/77     Pulse Rate 06/22/17 2035 86     Resp 06/22/17 2035 17     Temp 06/22/17 2035 97.8 F (36.6 C)     Temp Source 06/22/17 2035 Axillary     SpO2 06/22/17 2035 100 %     Weight 06/22/17 2039 100 lb (45.4 kg)     Height --      Head Circumference --      Peak Flow --      Pain Score --  Pain Loc --      Pain Edu? --      Excl. in [place]? --     Constitutional: Alert, at baseline mental status per EMS. Eyes: Conjunctivae are normal.  EOMI.  PERRLA. Head: 3 cm superficial abrasion to midline parietal area. Nose: No congestion/rhinnorhea. Mouth/Throat: Mucous membranes are moist.   Neck: Normal range of motion.  No midline cervical spinal tenderness. Cardiovascular: Good peripheral circulation. Respiratory: Normal respiratory effort.  Gastrointestinal: Soft and nontender. No distention.  Genitourinary: No flank tenderness. Musculoskeletal:  Extremities warm and well perfused.  FROM toall 4 extremities.  No midline spinal tenderness. Neurologic:  Normal speech and language.  Motor intact in all extremities.  No gross focal neurologic deficits  are appreciated.  Skin:  Skin is warm and dry. No rash noted. Psychiatric: Speech and behavior are normal.  ____________________________________________   LABS (all labs ordered are listed, but only abnormal results are displayed)  Labs Reviewed - No data to display ____________________________________________  EKG   ____________________________________________  RADIOLOGY  CT head: No ICH or other acute findings CT C-spine: No acute fracture  ____________________________________________   PROCEDURES  Procedure(s) performed: No  Procedures  Critical Care performed: No ____________________________________________   INITIAL IMPRESSION / ASSESSMENT AND PLAN / ED COURSE  Pertinent labs & imaging results that were available during my care of the patient were reviewed by me and considered in my medical decision making (see chart for details).  84 year old female with PMH as noted above including history of dementia presents from her nursing facility after an apparent mechanical fall from standing height which was unwitnessed.  On exam, the patient has an abrasion to the back of her head.  Vital signs are normal except for slight hypertension.  The patient is comfortable appearing.  There is no other evidence of trauma.Per EMS, the patient is at her baseline mental status.  Plan: CT head and C-spine to rule out acute fracture or ICH.  No indication for lab work-up at this time.  If negative imaging, plan for discharge back to her facility.  ----------------------------------------- 10:36 PM on 06/22/2017 -----------------------------------------  CTs are negative for acute findings.  Patient's family member is here and I was able to inform her about the results of the work-up.  The patient is safe for discharge home.  Return precautions given, and the patient's family member expresses understanding.  ____________________________________________   FINAL CLINICAL  IMPRESSION(S) / ED DIAGNOSES  Final diagnoses:  Contusion of scalp, initial encounter  Injury of head, initial encounter      NEW MEDICATIONS STARTED DURING THIS VISIT:  New Prescriptions   No medications on file     Note:  This document was prepared using Dragon voice recognition software and may include unintentional dictation errors.    Arta Silence, MD 06/22/17 2237

## 2017-06-22 NOTE — ED Notes (Signed)
This RN reviewed discharge instructions with patient and family. Family verbalized understanding of all information discussed.   This RN called [facility] and reviewed discharge instructions with primary RN on duty. RN verbalized understanding of all information discussed.

## 2017-06-22 NOTE — ED Notes (Signed)
Patient transported to CT 

## 2017-06-22 NOTE — ED Triage Notes (Signed)
Patient coming EMS from [facility] due to witnessed mechanical fall. Patient has abrasion to posterior head. Patient has hx of HOH, dementia and hypertension.

## 2017-06-22 NOTE — ED Notes (Signed)
Fall mats placed on floor

## 2017-07-07 ENCOUNTER — Emergency Department: Payer: Medicare Other

## 2017-07-07 ENCOUNTER — Observation Stay
Admission: EM | Admit: 2017-07-07 | Discharge: 2017-07-09 | Disposition: A | Discharge status: Home / Self Care | Payer: Medicare Other | Attending: Internal Medicine | Admitting: Internal Medicine

## 2017-07-07 DIAGNOSIS — Z8711 Personal history of peptic ulcer disease: Secondary | ICD-10-CM | POA: Insufficient documentation

## 2017-07-07 DIAGNOSIS — Z905 Acquired absence of kidney: Secondary | ICD-10-CM | POA: Diagnosis not present

## 2017-07-07 DIAGNOSIS — Z79899 Other long term (current) drug therapy: Secondary | ICD-10-CM | POA: Insufficient documentation

## 2017-07-07 DIAGNOSIS — F0390 Unspecified dementia without behavioral disturbance: Secondary | ICD-10-CM | POA: Insufficient documentation

## 2017-07-07 DIAGNOSIS — K920 Hematemesis: Principal | ICD-10-CM | POA: Insufficient documentation

## 2017-07-07 DIAGNOSIS — Z66 Do not resuscitate: Secondary | ICD-10-CM | POA: Diagnosis not present

## 2017-07-07 DIAGNOSIS — I10 Essential (primary) hypertension: Secondary | ICD-10-CM | POA: Diagnosis not present

## 2017-07-07 DIAGNOSIS — M1990 Unspecified osteoarthritis, unspecified site: Secondary | ICD-10-CM | POA: Diagnosis not present

## 2017-07-07 DIAGNOSIS — E785 Hyperlipidemia, unspecified: Secondary | ICD-10-CM | POA: Insufficient documentation

## 2017-07-07 DIAGNOSIS — E119 Type 2 diabetes mellitus without complications: Secondary | ICD-10-CM | POA: Diagnosis not present

## 2017-07-07 DIAGNOSIS — Z888 Allergy status to other drugs, medicaments and biological substances status: Secondary | ICD-10-CM | POA: Diagnosis not present

## 2017-07-07 DIAGNOSIS — M810 Age-related osteoporosis without current pathological fracture: Secondary | ICD-10-CM | POA: Diagnosis not present

## 2017-07-07 DIAGNOSIS — Z9010 Acquired absence of unspecified breast and nipple: Secondary | ICD-10-CM | POA: Diagnosis not present

## 2017-07-07 DIAGNOSIS — Z90710 Acquired absence of both cervix and uterus: Secondary | ICD-10-CM | POA: Insufficient documentation

## 2017-07-07 DIAGNOSIS — Z87891 Personal history of nicotine dependence: Secondary | ICD-10-CM | POA: Insufficient documentation

## 2017-07-07 DIAGNOSIS — R413 Other amnesia: Secondary | ICD-10-CM

## 2017-07-07 DIAGNOSIS — K922 Gastrointestinal hemorrhage, unspecified: Secondary | ICD-10-CM | POA: Diagnosis present

## 2017-07-07 LAB — COMPREHENSIVE METABOLIC PANEL
ALK PHOS: 66 U/L (ref 38–126)
ALT: 14 U/L (ref 14–54)
AST: 23 U/L (ref 15–41)
Albumin: 3.5 g/dL (ref 3.5–5.0)
Anion gap: 6 (ref 5–15)
BILIRUBIN TOTAL: 0.5 mg/dL (ref 0.3–1.2)
BUN: 25 mg/dL — ABNORMAL HIGH (ref 6–20)
CALCIUM: 9.2 mg/dL (ref 8.9–10.3)
CO2: 27 mmol/L (ref 22–32)
CREATININE: 1 mg/dL (ref 0.44–1.00)
Chloride: 106 mmol/L (ref 101–111)
GFR calc Af Amer: 59 mL/min — ABNORMAL LOW (ref >60)
GFR, EST NON AFRICAN AMERICAN: 51 mL/min — AB (ref >60)
Glucose, Bld: 133 mg/dL — ABNORMAL HIGH (ref 65–99)
Potassium: 4.3 mmol/L (ref 3.5–5.1)
Sodium: 139 mmol/L (ref 135–145)
TOTAL PROTEIN: 6.8 g/dL (ref 6.5–8.1)

## 2017-07-07 LAB — CBC
HEMATOCRIT: 43.1 % (ref 35.0–47.0)
HEMOGLOBIN: 14.5 g/dL (ref 12.0–16.0)
MCH: 31.3 pg (ref 26.0–34.0)
MCHC: 33.7 g/dL (ref 32.0–36.0)
MCV: 92.7 fL (ref 80.0–100.0)
Platelets: 224 10*3/uL (ref 150–440)
RBC: 4.65 MIL/uL (ref 3.80–5.20)
RDW: 13.4 % (ref 11.5–14.5)
WBC: 10.2 10*3/uL (ref 3.6–11.0)

## 2017-07-07 LAB — PROTIME-INR
INR: 0.92
Prothrombin Time: 12.3 seconds (ref 11.4–15.2)

## 2017-07-07 LAB — APTT: APTT: 32 s (ref 24–36)

## 2017-07-07 LAB — TROPONIN I

## 2017-07-07 MED ORDER — PANTOPRAZOLE SODIUM 40 MG IV SOLR
40.0000 mg | Freq: Once | INTRAVENOUS | Status: AC
  Administered 2017-07-08: 40 mg via INTRAVENOUS
  Filled 2017-07-07: qty 40

## 2017-07-07 MED ORDER — SODIUM CHLORIDE 0.9 % IV SOLN
8.0000 mg/h | INTRAVENOUS | Status: DC
  Administered 2017-07-08 – 2017-07-09 (×3): 8 mg/h via INTRAVENOUS
  Filled 2017-07-07 (×4): qty 80

## 2017-07-07 MED ORDER — SODIUM CHLORIDE 0.9 % IV BOLUS
1000.0000 mL | Freq: Once | INTRAVENOUS | Status: AC
  Administered 2017-07-08: 1000 mL via INTRAVENOUS

## 2017-07-07 NOTE — ED Notes (Signed)
Fall mats placed, yellow armband and socks placed on patient, fall risk sign placed, and mitts placed on patient's hands.

## 2017-07-07 NOTE — ED Notes (Signed)
[place] bed alarm set for pt

## 2017-07-07 NOTE — ED Triage Notes (Signed)
Patient coming from [facility]. Patient had multiple coffee ground emeses today

## 2017-07-07 NOTE — ED Provider Notes (Signed)
[facility] Emergency Department Provider Note   ____________________________________________   First MD Initiated Contact with Patient 07/07/17 2325     (approximate)  I have reviewed the triage vital signs and the nursing notes.   HISTORY  Chief Complaint Hematemesis  Level V caveat: limited by dementia  HPI Sylvia Juarez is a 84 y.o. female brought to the ED via EMS from [facility] with a chief complaint of hematemesis.  Nursing staff reports 4 episodes of emesis, last one with coffee ground substance.  Son, who is power of attorney, states patient only takes as needed Ativan.  Denies use of aspirin, NSAIDs, anticoagulants.  States patient has a remote history of peptic ulcer disease.  As far as he knows, patient has not had any fever, chills, chest pain, shortness of breath, abdominal pain, diarrhea.   Past Medical History:  Diagnosis Date  . Abnormal ECG    a. 05/2012 TWI in III, V1-V2.  Marland Kitchen Allergic rhinitis due to other allergen   . Arthritis   . Chest pain    a. 05/2012 Lexiscan MV: EF >65%, no ischemia/infarct.  . Degenerative joint disease of low back    a. With chronic back pain.  . Dementia   . History of tobacco abuse   . Hyperlipidemia   . Hypertension   . Malignant neoplasm of breast (female), unspecified site   . Osteoporosis, unspecified   . Peripheral Vascular Screening    a. 05/2012 ABI: R: 1.1, L: 1.1;  b. 05/2012 Carotid U/S: no hemodynamically significant stenosis bilat.  Marland Kitchen Spontaneous ecchymoses   . Type II or unspecified type diabetes mellitus without mention of complication, not stated as uncontrolled     Patient Active Problem List   Diagnosis Date Noted  . GIB (gastrointestinal bleeding) 07/08/2017  . Nonexudative age-related macular degeneration 12/04/2014  . Benign essential HTN 12/04/2014  . History of malignant neoplasm of female breast 12/04/2014  . Lumbosacral spondylosis 12/04/2014  . Dyslipidemia  12/04/2014  . Allergic state 12/04/2014  . H/O peptic ulcer 12/04/2014  . Bad memory 12/04/2014  . OP (osteoporosis) 12/04/2014  . Chest pain 05/28/2012  . Claudication ([place]) 05/28/2012    Past Surgical History:  Procedure Laterality Date  . BREAST BIOPSY Right   . CATARACT EXTRACTION, BILATERAL    . MASTECTOMY    . NEPHRECTOMY    . TOTAL ABDOMINAL HYSTERECTOMY      Prior to Admission medications   Medication Sig Start Date End Date Taking? Authorizing Provider  cyanocobalamin 500 MCG tablet Take 500 mcg by mouth daily.   Yes [provider]    Allergies Actonel [risedronate sodium] and Prednisone  Family History  Problem Relation Age of Onset  . Dementia Sister     Social History Social History   Tobacco Use  . Smoking status: Former Smoker    Years: 0.00    Types: Cigarettes  . Smokeless tobacco: Never Used  Substance Use Topics  . Alcohol use: No    Alcohol/week: 0.0 oz  . Drug use: No    Review of Systems  Constitutional: No fever/chills. Eyes: No visual changes. ENT: No sore throat. Cardiovascular: Denies chest pain. Respiratory: Denies shortness of breath. Gastrointestinal: No abdominal pain.  Positive for hematemesis.  No diarrhea.  No constipation. Genitourinary: Negative for dysuria. Musculoskeletal: Negative for back pain. Skin: Negative for rash. Neurological: Negative for headaches, focal weakness or numbness.   ____________________________________________   PHYSICAL EXAM:  VITAL SIGNS: ED Triage Vitals  Enc Vitals Group     BP 07/07/17 2242 127/67     Pulse Rate 07/07/17 2242 72     Resp 07/07/17 2242 17     Temp 07/07/17 2242 (!) 97.4 F (36.3 C)     Temp Source 07/07/17 2242 Oral     SpO2 07/07/17 2242 100 %     Weight 07/07/17 2300 100 lb (45.4 kg)     Height --      Head Circumference --      Peak Flow --      Pain Score --      Pain Loc --      Pain Edu? --      Excl. in [place]? --     Constitutional: Alert and  oriented. Well appearing and in no acute distress. Eyes: Conjunctivae are normal. PERRL. EOMI. Head: Atraumatic. Nose: No congestion/rhinnorhea. Mouth/Throat: Mucous membranes are mildly dry.  Evidence of coffee-ground emesis on patient's chin and tongue. Neck: No stridor.   Cardiovascular: Normal rate, regular rhythm. Grossly normal heart sounds.  Good peripheral circulation. Respiratory: Normal respiratory effort.  No retractions. Lungs CTAB. Gastrointestinal: Soft and mildly tender to palpation epigastrium without rebound or guarding. No distention. No abdominal bruits. No CVA tenderness.  Musculoskeletal: No lower extremity tenderness nor edema.  No joint effusions. Neurologic:  Normal speech and language. No gross focal neurologic deficits are appreciated. No gait instability. Skin:  Skin is warm, dry and intact. No rash noted. Psychiatric: Mood and affect are normal. Speech and behavior are normal.  ____________________________________________   LABS (all labs ordered are listed, but only abnormal results are displayed)  Labs Reviewed  COMPREHENSIVE METABOLIC PANEL - Abnormal; Notable for the following components:      Result Value   Glucose, Bld 133 (*)    BUN 25 (*)    GFR calc non Af Amer 51 (*)    GFR calc Af Amer 59 (*)    All other components within normal limits  CBC  TROPONIN I  APTT  PROTIME-INR  CBC  BASIC METABOLIC PANEL  TYPE AND SCREEN   ____________________________________________  EKG  ED ECG REPORT I, Bailynn Dyk J, the attending physician, personally viewed and interpreted this ECG.   Date: 07/07/2017  EKG Time: 2250  Rate: 72  Rhythm: normal EKG, normal sinus rhythm  Axis: Normal  Intervals:none  ST&T Change: Nonspecific  ____________________________________________  RADIOLOGY  ED MD interpretation: No acute cardiopulmonary process  Official radiology report(s): Dg Chest Port 1 View  Result Date: 07/07/2017 CLINICAL DATA:  Altered  mental status, coffee ground emesis EXAM: PORTABLE CHEST 1 VIEW COMPARISON:  10/06/2013 FINDINGS: No acute consolidation or effusion. Cardiomediastinal silhouette within normal limits. Aortic atherosclerosis. No pneumothorax. Surgical changes in the upper abdomen. IMPRESSION: No active disease. Electronically Signed   By: Donavan Foil M.D.   On: 07/07/2017 23:55    ____________________________________________   PROCEDURES  Procedure(s) performed:   Rectal exam: External exam within normal limits without hemorrhoids.  Dark brown stool obtained on gloved finger which is faintly heme positive.  Procedures  Critical Care performed: No  ____________________________________________   INITIAL IMPRESSION / ASSESSMENT AND PLAN / ED COURSE  As part of my medical decision making, I reviewed the following data within the [facility] History obtained from family, Nursing notes reviewed and incorporated, Labs reviewed, EKG interpreted, Old chart reviewed, Radiograph reviewed, Discussed with admitting physician and Notes from prior ED visits   84 year old female with a remote history of  PUD who presents with hematemesis.  Differential diagnosis includes but is not limited to upper GI bleed, PUD, gastritis, etc.  Initial H/H stable.  Awaiting results of metabolic panel and troponin.  Will initiate IV Protonix bolus with drip.  Anticipate hospitalization.  Patient's son is her power of attorney.  While she is she does not have a formal DNR paper, he states those are her wishes.   Clinical Course as of Jul 09 514  Sun Jul 08, 2017  [id number] Reviewed metabolic panel and troponin. Discussed with hospitalist to evaluate patient in the emergency department for admission.   [JS]    Clinical Course User Index [JS] Paulette Blanch, MD     ____________________________________________   FINAL CLINICAL IMPRESSION(S) / ED DIAGNOSES  Final diagnoses:  Hematemesis, presence of nausea not  specified     ED Discharge Orders    None       Note:  This document was prepared using Dragon voice recognition software and may include unintentional dictation errors.    Paulette Blanch, MD 07/08/17 [phone]

## 2017-07-08 ENCOUNTER — Other Ambulatory Visit: Payer: Self-pay

## 2017-07-08 DIAGNOSIS — K920 Hematemesis: Secondary | ICD-10-CM | POA: Diagnosis not present

## 2017-07-08 DIAGNOSIS — K922 Gastrointestinal hemorrhage, unspecified: Secondary | ICD-10-CM | POA: Diagnosis present

## 2017-07-08 LAB — GLUCOSE, CAPILLARY: GLUCOSE-CAPILLARY: 115 mg/dL — AB (ref 65–99)

## 2017-07-08 LAB — BASIC METABOLIC PANEL
Anion gap: 4 — ABNORMAL LOW (ref 5–15)
BUN: 21 mg/dL — AB (ref 6–20)
CHLORIDE: 109 mmol/L (ref 101–111)
CO2: 25 mmol/L (ref 22–32)
Calcium: 8.6 mg/dL — ABNORMAL LOW (ref 8.9–10.3)
Creatinine, Ser: 0.93 mg/dL (ref 0.44–1.00)
GFR calc Af Amer: >60 mL/min (ref >60)
GFR, EST NON AFRICAN AMERICAN: 56 mL/min — AB (ref >60)
GLUCOSE: 119 mg/dL — AB (ref 65–99)
Potassium: 4.3 mmol/L (ref 3.5–5.1)
SODIUM: 138 mmol/L (ref 135–145)

## 2017-07-08 LAB — CBC
HCT: 40.8 % (ref 35.0–47.0)
Hemoglobin: 13.8 g/dL (ref 12.0–16.0)
MCH: 31.6 pg (ref 26.0–34.0)
MCHC: 33.9 g/dL (ref 32.0–36.0)
MCV: 93.3 fL (ref 80.0–100.0)
PLATELETS: 218 10*3/uL (ref 150–440)
RBC: 4.38 MIL/uL (ref 3.80–5.20)
RDW: 13.6 % (ref 11.5–14.5)
WBC: 8.6 10*3/uL (ref 3.6–11.0)

## 2017-07-08 LAB — TYPE AND SCREEN
ABO/RH(D): O POS
ANTIBODY SCREEN: NEGATIVE

## 2017-07-08 LAB — MRSA PCR SCREENING: MRSA BY PCR: NEGATIVE

## 2017-07-08 MED ORDER — ONDANSETRON HCL 4 MG PO TABS: 4.0000 mg | ORAL_TABLET | Freq: Four times a day (QID) | ORAL | Status: DC | PRN

## 2017-07-08 MED ORDER — DOCUSATE SODIUM 100 MG PO CAPS
100.0000 mg | ORAL_CAPSULE | Freq: Two times a day (BID) | ORAL | Status: DC
  Filled 2017-07-08 (×2): qty 1

## 2017-07-08 MED ORDER — VITAMIN B-12 1000 MCG PO TABS
500.0000 ug | ORAL_TABLET | Freq: Every day | ORAL | Status: DC
  Administered 2017-07-08 – 2017-07-09 (×2): 500 ug via ORAL
  Filled 2017-07-08 (×2): qty 1

## 2017-07-08 MED ORDER — SODIUM CHLORIDE 0.9 % IV SOLN
Freq: Once | INTRAVENOUS | Status: AC
  Administered 2017-07-08: 03:00:00 via INTRAVENOUS

## 2017-07-08 MED ORDER — ACETAMINOPHEN 325 MG PO TABS: 650.0000 mg | ORAL_TABLET | Freq: Four times a day (QID) | ORAL | Status: DC | PRN

## 2017-07-08 MED ORDER — BISACODYL 5 MG PO TBEC: 5.0000 mg | DELAYED_RELEASE_TABLET | Freq: Every day | ORAL | Status: DC | PRN

## 2017-07-08 MED ORDER — HYDROCODONE-ACETAMINOPHEN 5-325 MG PO TABS: 1.0000 | ORAL_TABLET | ORAL | Status: DC | PRN

## 2017-07-08 MED ORDER — ACETAMINOPHEN 650 MG RE SUPP: 650.0000 mg | Freq: Four times a day (QID) | RECTAL | Status: DC | PRN

## 2017-07-08 MED ORDER — TRAZODONE HCL 50 MG PO TABS: 25.0000 mg | ORAL_TABLET | Freq: Every evening | ORAL | Status: DC | PRN

## 2017-07-08 MED ORDER — ONDANSETRON HCL 4 MG/2ML IJ SOLN
4.0000 mg | Freq: Four times a day (QID) | INTRAMUSCULAR | Status: DC | PRN
  Administered 2017-07-08: 4 mg via INTRAVENOUS
  Filled 2017-07-08: qty 2

## 2017-07-08 NOTE — H&P (Signed)
[place] [facility] [place] NAME: Sylvia Juarez    MR#:  [id number]  DATE OF BIRTH:  1932-03-18  DATE OF ADMISSION:  07/07/2017  PRIMARY CARE PHYSICIAN: Glean Hess, MD   REQUESTING/REFERRING PHYSICIAN:   CHIEF COMPLAINT:   Chief Complaint  Patient presents with  . Hematemesis    HISTORY OF PRESENT ILLNESS: Sylvia Juarez  is a 84 y.o. female with a known history of dementia, hypertension, hyperlipidemia. Patient is not able to provide history, due to confusion from dementia.  So the information was obtained from reviewing the medical chart and from discussion with the emergency room physician. Patient was brought to emergency room via EMS from [facility], due to coffee-ground emesis.  Per nursing home staff reports patient had 4 episodes of emesis in the past 24 hours, last one with coffee-ground substance.  Patient is not on any anticoagulant; no aspirin, no NSAIDs, she only takes as needed Ativan.  Patient was not noticed with fever, chills, chest pain, shortness of breath, abdominal pain or diarrhea. Blood test done emergency room, including CBC and CMP are essentially unremarkable.  Chest x-ray, reviewed by myself, is negative for active cardiopulmonary disease. It is admitted for further evaluation and treatment.  PAST MEDICAL HISTORY:   Past Medical History:  Diagnosis Date  . Abnormal ECG    a. 05/2012 TWI in III, V1-V2.  Marland Kitchen Allergic rhinitis due to other allergen   . Arthritis   . Chest pain    a. 05/2012 Lexiscan MV: EF >65%, no ischemia/infarct.  . Degenerative joint disease of low back    a. With chronic back pain.  . Dementia   . History of tobacco abuse   . Hyperlipidemia   . Hypertension   . Malignant neoplasm of breast (female), unspecified site   . Osteoporosis, unspecified   . Peripheral Vascular Screening    a. 05/2012 ABI: R: 1.1, L: 1.1;  b. 05/2012 Carotid U/S: no hemodynamically significant  stenosis bilat.  Marland Kitchen Spontaneous ecchymoses   . Type II or unspecified type diabetes mellitus without mention of complication, not stated as uncontrolled     PAST SURGICAL HISTORY:  Past Surgical History:  Procedure Laterality Date  . BREAST BIOPSY Right   . CATARACT EXTRACTION, BILATERAL    . MASTECTOMY    . NEPHRECTOMY    . TOTAL ABDOMINAL HYSTERECTOMY      SOCIAL HISTORY:  Social History   Tobacco Use  . Smoking status: Former Smoker    Years: 0.00    Types: Cigarettes  . Smokeless tobacco: Never Used  Substance Use Topics  . Alcohol use: No    Alcohol/week: 0.0 oz    FAMILY HISTORY:  Family History  Problem Relation Age of Onset  . Dementia Sister     DRUG ALLERGIES:  Allergies  Allergen Reactions  . Actonel [Risedronate Sodium]     Unknown reaction  . Prednisone Other (See Comments)    REVIEW OF SYSTEMS:   Unable to obtain, due to patient's confusion.  MEDICATIONS AT HOME:  Prior to Admission medications   Medication Sig Start Date End Date Taking? Authorizing Provider  cyanocobalamin 500 MCG tablet Take 500 mcg by mouth daily.   Yes [provider]      PHYSICAL EXAMINATION:   VITAL SIGNS: Blood pressure (!) 117/50, pulse 70, temperature (!) 97.5 F (36.4 C), temperature source Oral, resp. rate 18, weight 45.1 kg (99 lb 8 oz), SpO2 100 %.  GENERAL:  84 y.o.-year-old patient lying in the bed with no acute distress.  EYES: Pupils equal, round, reactive to light and accommodation. No scleral icterus.   HEENT: Head atraumatic, normocephalic. Oropharynx and nasopharynx clear.  NECK:  Supple, no jugular venous distention. No thyroid enlargement, no tenderness.  LUNGS: Reduced breath sounds bilaterally, no wheezing, rales,rhonchi or crepitation. No use of accessory muscles of respiration.  CARDIOVASCULAR: S1, S2 normal. No S3/S4.  ABDOMEN: Soft, nontender, nondistended. Bowel sounds present. No organomegaly or mass.  EXTREMITIES: No pedal edema,  cyanosis, or clubbing.  NEUROLOGIC: No focal weakness appreciated. PSYCHIATRIC: The patient is alert, but pleasantly confused.  SKIN: No obvious rash, lesion, or ulcer.   LABORATORY PANEL:   CBC Recent Labs  Lab 07/07/17 2250 07/08/17 0450  WBC 10.2 8.6  HGB 14.5 13.8  HCT 43.1 40.8  PLT 224 218  MCV 92.7 93.3  MCH 31.3 31.6  MCHC 33.7 33.9  RDW 13.4 13.6   ------------------------------------------------------------------------------------------------------------------  Chemistries  Recent Labs  Lab 07/07/17 2250 07/08/17 0450  NA 139 138  K 4.3 4.3  CL 106 109  CO2 27 25  GLUCOSE 133* 119*  BUN 25* 21*  CREATININE 1.00 0.93  CALCIUM 9.2 8.6*  AST 23  --   ALT 14  --   ALKPHOS 66  --   BILITOT 0.5  --    ------------------------------------------------------------------------------------------------------------------ CrCl cannot be calculated (Unknown ideal weight.). ------------------------------------------------------------------------------------------------------------------ No results for input(s): TSH, T4TOTAL, T3FREE, THYROIDAB in the last 72 hours.  Invalid input(s): FREET3   Coagulation profile Recent Labs  Lab 07/07/17 2250  INR 0.92   ------------------------------------------------------------------------------------------------------------------- No results for input(s): DDIMER in the last 72 hours. -------------------------------------------------------------------------------------------------------------------  Cardiac Enzymes Recent Labs  Lab 07/07/17 2250  TROPONINI <0.03   ------------------------------------------------------------------------------------------------------------------ Invalid input(s): POCBNP  ---------------------------------------------------------------------------------------------------------------  Urinalysis No results found for: COLORURINE, APPEARANCEUR, LABSPEC, PHURINE, GLUCOSEU, HGBUR,  BILIRUBINUR, KETONESUR, PROTEINUR, UROBILINOGEN, NITRITE, LEUKOCYTESUR   RADIOLOGY: Dg Chest Port 1 View  Result Date: 07/07/2017 CLINICAL DATA:  Altered mental status, coffee ground emesis EXAM: PORTABLE CHEST 1 VIEW COMPARISON:  10/06/2013 FINDINGS: No acute consolidation or effusion. Cardiomediastinal silhouette within normal limits. Aortic atherosclerosis. No pneumothorax. Surgical changes in the upper abdomen. IMPRESSION: No active disease. Electronically Signed   By: Donavan Foil M.D.   On: 07/07/2017 23:55    EKG: Orders placed or performed during the hospital encounter of 07/07/17  . ED EKG  . ED EKG  . EKG 12-Lead  . EKG 12-Lead    IMPRESSION AND PLAN:  1.  Acute upper GI bleed.  We will start Protonix IV.  We will keep patient n.p.o.. We will call gastroenterology for further evaluation and treatment.  Per patient's son, they would like to avoid invasive procedures, if possible. 2.  Advanced dementia, stable, continue to monitor clinically closely.  All the records are reviewed and case discussed with ED provider. Management plans discussed with the patient, family and they are in agreement.  CODE STATUS:DNR    Code Status Orders  (From admission, onward)        Start     Ordered   07/08/17 0239  Do not attempt resuscitation (DNR)  Continuous    Question Answer Comment  In the event of cardiac or respiratory ARREST Do not call a "code blue"   In the event of cardiac or respiratory ARREST Do not perform Intubation, CPR, defibrillation or ACLS   In the event of cardiac or respiratory ARREST Use medication by any route, position,  wound care, and other measures to relive pain and suffering. May use oxygen, suction and manual treatment of airway obstruction as needed for comfort.      07/08/17 0238    Code Status History    Date Active Date Inactive Code Status Order ID Comments User Context   07/08/2017 0209 07/08/2017 0238 Full Code [id number]  Amelia Jo, MD  Inpatient    Advance Directive Documentation     Most Recent Value  Type of Advance Directive  Healthcare Power of Attorney  Pre-existing out of facility DNR order (yellow form or pink MOST form)  -  "MOST" Form in Place?  -       TOTAL TIME TAKING CARE OF THIS PATIENT: 45 minutes.    Amelia Jo M.D on 07/08/2017 at 5:35 AM  Between 7am to 6pm - Pager - [phone]  After 6pm go to [email or web address] - password [email or web address] [facility]  [facility]  Office  [phone]  CC: Primary care physician; Glean Hess, MD

## 2017-07-08 NOTE — Clinical Social Work Note (Signed)
CSW received consult that patient admitted from [facility]. CSW will assess when able.  Santiago Bumpers, MSW, Latanya Presser [phone]

## 2017-07-08 NOTE — Progress Notes (Signed)
Patient here with coffee ground emesis None since she has been here  Monitor h/h and tx if needed Supportive care no aggressive management as per family  D/w GI

## 2017-07-08 NOTE — Consult Note (Signed)
Sylvia Antigua, MD [address], [place], [place], [place], [address] [address], [place], [place], [place], [address] Phone: [phone]  Fax: [phone]  Consultation  Referring Provider:     Dr. Benjie Karvonen Primary Care Physician:  Glean Hess, MD Reason for Consultation:     Coffee-ground emesis  Date of Admission:  07/07/2017 Date of Consultation:  07/08/2017         HPI:   Sylvia Juarez is a 84 y.o. female with history of dementia, unable to provide history, sent from living facility due to coffee-ground emesis.  As per documentation, nursing staff reported 4 episodes of emesis in the 24 hours prior to presentation, the consisted of coffee-ground substance.  Patient is not on NSAIDs, aspirin, or anticoagulants as an outpatient.  No abdominal pain, shortness of breath, fever or chills, or diarrhea reported by nursing staff, as per patient's H&P.  Hemoglobin normal and stable throughout presentation, currently at 13.8.   Past Medical History:  Diagnosis Date  . Abnormal ECG    a. 05/2012 TWI in III, V1-V2.  Marland Kitchen Allergic rhinitis due to other allergen   . Arthritis   . Chest pain    a. 05/2012 Lexiscan MV: EF >65%, no ischemia/infarct.  . Degenerative joint disease of low back    a. With chronic back pain.  . Dementia   . History of tobacco abuse   . Hyperlipidemia   . Hypertension   . Malignant neoplasm of breast (female), unspecified site   . Osteoporosis, unspecified   . Peripheral Vascular Screening    a. 05/2012 ABI: R: 1.1, L: 1.1;  b. 05/2012 Carotid U/S: no hemodynamically significant stenosis bilat.  Marland Kitchen Spontaneous ecchymoses   . Type II or unspecified type diabetes mellitus without mention of complication, not stated as uncontrolled     Past Surgical History:  Procedure Laterality Date  . BREAST BIOPSY Right   . CATARACT EXTRACTION, BILATERAL    . MASTECTOMY    . NEPHRECTOMY    . TOTAL ABDOMINAL HYSTERECTOMY      Prior to Admission  medications   Medication Sig Start Date End Date Taking? Authorizing Provider  cyanocobalamin 500 MCG tablet Take 500 mcg by mouth daily.   Yes [provider]    Family History  Problem Relation Age of Onset  . Dementia Sister      Social History   Tobacco Use  . Smoking status: Former Smoker    Years: 0.00    Types: Cigarettes  . Smokeless tobacco: Never Used  Substance Use Topics  . Alcohol use: No    Alcohol/week: 0.0 oz  . Drug use: No    Allergies as of 07/07/2017 - Review Complete 07/07/2017  Allergen Reaction Noted  . Actonel [risedronate sodium]  05/11/2017  . Prednisone Other (See Comments) 02/19/2015    Review of Systems:    All systems reviewed and negative except where noted in HPI.   Physical Exam:  Vital signs in last 24 hours: Vitals:   07/08/17 0100 07/08/17 0115 07/08/17 0208 07/08/17 0556  BP: 138/85 133/76 (!) 117/50 138/62  Pulse:   70 73  Resp: 13 18 18 20   Temp:   (!) 97.5 F (36.4 C) 99.1 F (37.3 C)  TempSrc:   Oral   SpO2:      Weight:   99 lb 8 oz (45.1 kg)      General:   Pleasant, cooperative in NAD Head:  Normocephalic and atraumatic. Eyes:  No icterus.   Conjunctiva pink. PERRLA. Ears:  Normal auditory acuity. Neck:  Supple; no masses or thyroidomegaly Lungs: Respirations even and unlabored. Lungs clear to auscultation bilaterally.   No wheezes, crackles, or rhonchi.  Abdomen:  Soft, nondistended, nontender. Normal bowel sounds. No appreciable masses or hepatomegaly.  No rebound or guarding.  Neurologic:  Alert;  grossly normal neurologically. Skin:  Intact without significant lesions or rashes. Cervical Nodes:  No significant cervical adenopathy. Psych:  Alert and cooperative. Normal affect.  LAB RESULTS: Recent Labs    07/07/17 2250 07/08/17 0450  WBC 10.2 8.6  HGB 14.5 13.8  HCT 43.1 40.8  PLT 224 218   BMET Recent Labs    07/07/17 2250 07/08/17 0450  NA 139 138  K 4.3 4.3  CL 106 109  CO2 27 25    GLUCOSE 133* 119*  BUN 25* 21*  CREATININE 1.00 0.93  CALCIUM 9.2 8.6*   LFT Recent Labs    07/07/17 2250  PROT 6.8  ALBUMIN 3.5  AST 23  ALT 14  ALKPHOS 66  BILITOT 0.5   PT/INR Recent Labs    07/07/17 2250  LABPROT 12.3  INR 0.92    STUDIES: Dg Chest Port 1 View  Result Date: 07/07/2017 CLINICAL DATA:  Altered mental status, coffee ground emesis EXAM: PORTABLE CHEST 1 VIEW COMPARISON:  10/06/2013 FINDINGS: No acute consolidation or effusion. Cardiomediastinal silhouette within normal limits. Aortic atherosclerosis. No pneumothorax. Surgical changes in the upper abdomen. IMPRESSION: No active disease. Electronically Signed   By: Donavan Foil M.D.   On: 07/07/2017 23:55      Impression / Plan:   Sylvia Juarez is a 84 y.o. y/o female with reported coffee-ground emesis at living facility, with normal hemoglobin, and no hemodynamic changes, no NSAID use or anticoagulation at home  Coffee-ground emesis, is not specific for GI bleeding Normal hemoglobin, no other evidence of bleeding, no hemodynamic changes, suggest that the coffee grounds did not signify an active bleed  However, given her age, and emesis with question of coffee-ground exceptions, agree with treatment with IV PPI to treat any underlying peptic ulcer disease  Protonix 40 IV twice daily If no further emesis, and hemoglobin remains stable, can change this to p.o. dosing in 1 to 2 days Continue serial CBCs and transfuse. Avoid NSAIDs  Coffee-ground emesis may have been due to Mallory-Weiss tear as well, versus esophagitis, versus just retained food in the stomach  Clear liquids okay for today, advance diet tomorrow if no further bleeding  No indication for emergent endoscopy at this time, given the above  Please page GI with any signs of active GI bleeding, hemodynamic changes or any other reason for concern  No episodes of emesis since presentation to hospital Use antiemetics as needed if  emesis occurs  Above plan discussed with Dr. Benjie Karvonen    Thank you for involving me in the care of this patient.      LOS: 0 days   Virgel Manifold, MD  07/08/2017, 10:51 AM

## 2017-07-09 DIAGNOSIS — K922 Gastrointestinal hemorrhage, unspecified: Secondary | ICD-10-CM | POA: Diagnosis present

## 2017-07-09 LAB — CBC
HEMATOCRIT: 42.7 % (ref 35.0–47.0)
HEMOGLOBIN: 14.3 g/dL (ref 12.0–16.0)
MCH: 31.4 pg (ref 26.0–34.0)
MCHC: 33.5 g/dL (ref 32.0–36.0)
MCV: 93.7 fL (ref 80.0–100.0)
Platelets: 255 10*3/uL (ref 150–440)
RBC: 4.56 MIL/uL (ref 3.80–5.20)
RDW: 13.7 % (ref 11.5–14.5)
WBC: 9.8 10*3/uL (ref 3.6–11.0)

## 2017-07-09 LAB — GLUCOSE, CAPILLARY: Glucose-Capillary: 114 mg/dL — ABNORMAL HIGH (ref 65–99)

## 2017-07-09 MED ORDER — PANTOPRAZOLE SODIUM 40 MG PO TBEC: 40.0000 mg | DELAYED_RELEASE_TABLET | Freq: Every day | ORAL | 0 refills | Status: DC

## 2017-07-09 NOTE — Discharge Summary (Signed)
[facility] NAME: Sylvia Juarez    MR#:  [id number]  DATE OF BIRTH:  August 03, 1932  DATE OF ADMISSION:  07/07/2017 ADMITTING PHYSICIAN: Amelia Jo, MD  DATE OF DISCHARGE: 07/09/2017  PRIMARY CARE PHYSICIAN: Glean Hess, MD    ADMISSION DIAGNOSIS:  Hematemesis, presence of nausea not specified [K92.0]  DISCHARGE DIAGNOSIS:  Active Problems:   GIB (gastrointestinal bleeding)   SECONDARY DIAGNOSIS:   Past Medical History:  Diagnosis Date  . Abnormal ECG    a. 05/2012 TWI in III, V1-V2.  Marland Kitchen Allergic rhinitis due to other allergen   . Arthritis   . Chest pain    a. 05/2012 Lexiscan MV: EF >65%, no ischemia/infarct.  . Degenerative joint disease of low back    a. With chronic back pain.  . Dementia   . History of tobacco abuse   . Hyperlipidemia   . Hypertension   . Malignant neoplasm of breast (female), unspecified site   . Osteoporosis, unspecified   . Peripheral Vascular Screening    a. 05/2012 ABI: R: 1.1, L: 1.1;  b. 05/2012 Carotid U/S: no hemodynamically significant stenosis bilat.  Marland Kitchen Spontaneous ecchymoses   . Type II or unspecified type diabetes mellitus without mention of complication, not stated as uncontrolled     HOSPITAL COURSE:   84 year old female with history of dementia and hypertension who presents from [facility] due to coffee-ground emesis.  1.  Coffee-ground emesis: Patient had no coffee-ground emesis while in the hospital.  Patient was evaluated by GI.  Coffee-ground emesis is not specific for GI bleeding.  Her hemoglobin has remained completely stable.   No indication for endoscopy at this time.  Patient will be discharged on Protonix 40 mg daily.  2.  Dementia: Severe Patient would benefit from outpatient palliative care consult  3.  Essential hypertension: Patient currently not on any medications DISCHARGE CONDITIONS AND DIET:   Stable for discharge dysphasia 3 diet with aspiration  precautions  CONSULTS OBTAINED:    DRUG ALLERGIES:   Allergies  Allergen Reactions  . Actonel [Risedronate Sodium]     Unknown reaction  . Prednisone Other (See Comments)    DISCHARGE MEDICATIONS:   Allergies as of 07/09/2017      Reactions   Actonel [risedronate Sodium]    Unknown reaction   Prednisone Other (See Comments)      Medication List    TAKE these medications   pantoprazole 40 MG tablet Commonly known as:  PROTONIX Take 1 tablet (40 mg total) by mouth daily.   vitamin B-12 500 MCG tablet Commonly known as:  CYANOCOBALAMIN Take 500 mcg by mouth daily.         Today   CHIEF COMPLAINT:   No acute events overnight patient is pleasantly demented   VITAL SIGNS:  Blood pressure 138/82, pulse 79, temperature 98.6 F (37 C), resp. rate 19, weight 45.1 kg (99 lb 8 oz), SpO2 96 %.   REVIEW OF SYSTEMS:  Review of Systems  Unable to perform ROS: Dementia     PHYSICAL EXAMINATION:  GENERAL:  84 y.o.-year-old patient lying in the bed with no acute distress.  NECK:  Supple, no jugular venous distention. No thyroid enlargement, no tenderness.  LUNGS: Normal breath sounds bilaterally, no wheezing, rales,rhonchi  No use of accessory muscles of respiration.  CARDIOVASCULAR: S1, S2 normal. No murmurs, rubs, or gallops.  ABDOMEN: Soft, non-tender, non-distended. Bowel sounds present. No organomegaly or mass.  EXTREMITIES: No  pedal edema, cyanosis, or clubbing.  PSYCHIATRIC: The patient is alert and oriented x name only Pleasantly confused SKIN: No obvious rash, lesion, or ulcer.   DATA REVIEW:   CBC Recent Labs  Lab 07/09/17 0929  WBC 9.8  HGB 14.3  HCT 42.7  PLT 255    Chemistries  Recent Labs  Lab 07/07/17 2250 07/08/17 0450  NA 139 138  K 4.3 4.3  CL 106 109  CO2 27 25  GLUCOSE 133* 119*  BUN 25* 21*  CREATININE 1.00 0.93  CALCIUM 9.2 8.6*  AST 23  --   ALT 14  --   ALKPHOS 66  --   BILITOT 0.5  --     Cardiac Enzymes Recent  Labs  Lab 07/07/17 2250  TROPONINI <0.03    Microbiology Results  @MICRORSLT48 @  RADIOLOGY:  Dg Chest Port 1 View  Result Date: 07/07/2017 CLINICAL DATA:  Altered mental status, coffee ground emesis EXAM: PORTABLE CHEST 1 VIEW COMPARISON:  10/06/2013 FINDINGS: No acute consolidation or effusion. Cardiomediastinal silhouette within normal limits. Aortic atherosclerosis. No pneumothorax. Surgical changes in the upper abdomen. IMPRESSION: No active disease. Electronically Signed   By: Donavan Foil M.D.   On: 07/07/2017 23:55      Allergies as of 07/09/2017      Reactions   Actonel [risedronate Sodium]    Unknown reaction   Prednisone Other (See Comments)      Medication List    TAKE these medications   pantoprazole 40 MG tablet Commonly known as:  PROTONIX Take 1 tablet (40 mg total) by mouth daily.   vitamin B-12 500 MCG tablet Commonly known as:  CYANOCOBALAMIN Take 500 mcg by mouth daily.         Stable for discharge   Patient should follow up with pcp  CODE STATUS:     Code Status Orders  (From admission, onward)        Start     Ordered   07/08/17 0239  Do not attempt resuscitation (DNR)  Continuous    Question Answer Comment  In the event of cardiac or respiratory ARREST Do not call a "code blue"   In the event of cardiac or respiratory ARREST Do not perform Intubation, CPR, defibrillation or ACLS   In the event of cardiac or respiratory ARREST Use medication by any route, position, wound care, and other measures to relive pain and suffering. May use oxygen, suction and manual treatment of airway obstruction as needed for comfort.      07/08/17 0238    Code Status History    Date Active Date Inactive Code Status Order ID Comments User Context   07/08/2017 0209 07/08/2017 0238 Full Code [id number]  Amelia Jo, MD Inpatient    Advance Directive Documentation     Most Recent Value  Type of Advance Directive  Healthcare Power of Attorney  Pre-existing  out of facility DNR order (yellow form or pink MOST form)  -  "MOST" Form in Place?  -      TOTAL TIME TAKING CARE OF THIS PATIENT: 38 minutes.    Note: This dictation was prepared with Dragon dictation along with smaller phrase technology. Any transcriptional errors that result from this process are unintentional.  Jacklynn Dehaas M.D on 07/09/2017 at 10:20 AM  Between 7am to 6pm - Pager - [phone] After 6pm go to [email or web address] - password [email or web address] [facility]  Office  [phone]  CC: Primary care physician; Glean Hess, MD

## 2017-07-09 NOTE — Care Management CC44 (Signed)
Condition Code 44 Documentation Completed  Patient Details  Name: Sylvia Juarez MRN: [id number] Date of Birth: August 11, 1932   Condition Code 44 given:  Yes Patient signature on Condition Code 44 notice:  Yes Documentation of 2 MD's agreement:  Yes Code 44 added to claim:  Yes    Beverly Sessions, RN 07/09/2017, 12:13 PM

## 2017-07-09 NOTE — Clinical Social Work Note (Signed)
Clinical Social Work Assessment  Patient Details  Name: Sylvia Juarez MRN: [id number] Date of Birth: 02-05-1933  Date of referral:  07/09/17               Reason for consult:  Discharge Planning                Permission sought to share information with:    Permission granted to share information::     Name::        Agency::     Relationship::     Contact Information:     Housing/Transportation Living arrangements for the past 2 months:  [place] of Information:  Facility Patient Interpreter Needed:  None Criminal Activity/Legal Involvement Pertinent to Current Situation/Hospitalization:  No - Comment as needed Significant Relationships:  Adult Children Lives with:  Facility Resident Do you feel safe going back to the place where you live?  Yes Need for family participation in patient care:  Yes (Comment)  Care giving concerns:  Patient is a long term resident at [facility].   Social Worker assessment / plan:  Physician wishes to discharge patient today back to [facility]. CSW contacted [facility] and spoke with Levada Dy who approved for patient to return today. Discharge information and FL2 faxed to [facility]. Physician states she spoke with patient's son and he is aware of discharge and will be transporting patient back today. A nurse placed that patient has a guardian but has no identifying information for who the guardian is. There is no paperwork or identifying information regarding who the guardian is. [place] did not indicate that there was a guardian.  Employment status:  Retired Nurse, adult PT Recommendations:    Information / Referral to community resources:     Patient/Family's Response to care:  Patient's family contacted by physician and will transport patient today.  Patient/Family's Understanding of and Emotional Response to Diagnosis, Current Treatment, and Prognosis:  Family with no  concerns or questions of physician at this time.  Emotional Assessment Appearance:    Attitude/Demeanor/Rapport:    Affect (typically observed):    Orientation:    Alcohol / Substance use:  Not Applicable Psych involvement (Current and /or in the community):  No (Comment)  Discharge Needs  Concerns to be addressed:    Readmission within the last 30 days:  No Current discharge risk:  None Barriers to Discharge:  No Barriers Identified   Shela Leff, LCSW 07/09/2017, 11:54 AM

## 2017-07-09 NOTE — Progress Notes (Signed)
Sylvia Juarez  A and O x 2. VSS. Pt tolerating diet well. No complaints of pain or nausea. IV removed intact, prescriptions sent to [facility] with family. Family voiced understanding of discharge instructions with no further questions. Pt discharged via wheelchair with nurse tech.    Allergies as of 07/09/2017      Reactions   Actonel [risedronate Sodium]    Unknown reaction   Prednisone Other (See Comments)      Medication List    TAKE these medications   pantoprazole 40 MG tablet Commonly known as:  PROTONIX Take 1 tablet (40 mg total) by mouth daily.   vitamin B-12 500 MCG tablet Commonly known as:  CYANOCOBALAMIN Take 500 mcg by mouth daily.       Vitals:   07/09/17 0453 07/09/17 1330  BP: 138/82 133/71  Pulse: 79 82  Resp: 19 20  Temp: 98.6 F (37 C) 99.2 F (37.3 C)  SpO2: 96% 95%    Francesco Sor

## 2017-07-09 NOTE — Progress Notes (Signed)
RN called report to [facility]. Report given to [place].

## 2017-07-09 NOTE — NC FL2 (Signed)
  [place] LEVEL OF CARE SCREENING TOOL     IDENTIFICATION  Patient Name: Sylvia Juarez Birthdate: 1932-07-17 Sex: female Admission Date (Current Location): 07/07/2017  [place] and [place] Number:  Engineering geologist and Address:  [facility], [address] [place], [place] [address]      Provider Number: [id number]  Attending Physician Name and Address:  Bettey Costa, MD  Relative Name and Phone Number:       Current Level of Care: Hospital Recommended Level of Care: [place] Prior Approval Number:    Date Approved/Denied:   PASRR Number:    Discharge Plan: (ALF)    Current Diagnoses: Patient Active Problem List   Diagnosis Date Noted  . GIB (gastrointestinal bleeding) 07/08/2017  . Nonexudative age-related macular degeneration 12/04/2014  . Benign essential HTN 12/04/2014  . History of malignant neoplasm of female breast 12/04/2014  . Lumbosacral spondylosis 12/04/2014  . Dyslipidemia 12/04/2014  . Allergic state 12/04/2014  . H/O peptic ulcer 12/04/2014  . Bad memory 12/04/2014  . OP (osteoporosis) 12/04/2014  . Chest pain 05/28/2012  . Claudication ([place]) 05/28/2012    Orientation RESPIRATION BLADDER Height & Weight     Self, Place  Normal Incontinent Weight: 99 lb 8 oz (45.1 kg) Height:     BEHAVIORAL SYMPTOMS/MOOD NEUROLOGICAL BOWEL NUTRITION STATUS  (none) (none) Incontinent Diet(dysphagia 3)  AMBULATORY STATUS COMMUNICATION OF NEEDS Skin   Limited Assist Verbally Normal                       Personal Care Assistance Level of Assistance  Bathing, Feeding, Dressing Bathing Assistance: Limited assistance Feeding assistance: Limited assistance Dressing Assistance: Limited assistance     Functional Limitations Info  Sight, Hearing Sight Info: Impaired Hearing Info: Impaired      SPECIAL CARE FACTORS FREQUENCY                       Contractures Contractures Info:  Not present    Additional Factors Info  Code Status, Allergies Code Status Info: dnr Allergies Info: actonel;prednisone           DISCHARGE MEDICATIONS:        Allergies as of 07/09/2017      Reactions   Actonel [risedronate Sodium]    Unknown reaction   Prednisone Other (See Comments)         Medication List    TAKE these medications   pantoprazole 40 MG tablet Commonly known as:  PROTONIX Take 1 tablet (40 mg total) by mouth daily.   vitamin B-12 500 MCG tablet Commonly known as:  CYANOCOBALAMIN Take 500 mcg by mouth daily.      Additional Information    Shela Leff, LCSW

## 2017-07-09 NOTE — Care Management Obs Status (Signed)
[place] NOTIFICATION   Patient Details  Name: Sylvia Juarez MRN: [id number] Date of Birth: 1933-01-26   Medicare Observation Status Notification Given:  Yes    Beverly Sessions, RN 07/09/2017, 12:13 PM

## 2017-07-10 ENCOUNTER — Telehealth: Payer: Self-pay

## 2017-07-10 NOTE — Telephone Encounter (Signed)
Called to complete TCM since discharged from the hospital, spoke with patients son Quita Skye- patient is at assisted living facility and Dr.Smith will see patient there for hospital follow up.

## 2017-08-25 ENCOUNTER — Emergency Department: Payer: Medicare Other

## 2017-08-25 ENCOUNTER — Other Ambulatory Visit: Payer: Self-pay

## 2017-08-25 ENCOUNTER — Encounter: Payer: Self-pay | Admitting: Emergency Medicine

## 2017-08-25 ENCOUNTER — Inpatient Hospital Stay
Admission: EM | Admit: 2017-08-25 | Discharge: 2017-08-26 | DRG: 536 | Disposition: A | Discharge status: Hospice | Payer: Medicare Other | Attending: Internal Medicine | Admitting: Internal Medicine

## 2017-08-25 DIAGNOSIS — Z888 Allergy status to other drugs, medicaments and biological substances status: Secondary | ICD-10-CM

## 2017-08-25 DIAGNOSIS — F0280 Dementia in other diseases classified elsewhere without behavioral disturbance: Secondary | ICD-10-CM | POA: Diagnosis present

## 2017-08-25 DIAGNOSIS — Z905 Acquired absence of kidney: Secondary | ICD-10-CM | POA: Diagnosis not present

## 2017-08-25 DIAGNOSIS — Z87891 Personal history of nicotine dependence: Secondary | ICD-10-CM

## 2017-08-25 DIAGNOSIS — Z515 Encounter for palliative care: Secondary | ICD-10-CM | POA: Diagnosis not present

## 2017-08-25 DIAGNOSIS — Z90710 Acquired absence of both cervix and uterus: Secondary | ICD-10-CM | POA: Diagnosis not present

## 2017-08-25 DIAGNOSIS — K219 Gastro-esophageal reflux disease without esophagitis: Secondary | ICD-10-CM | POA: Diagnosis not present

## 2017-08-25 DIAGNOSIS — F0390 Unspecified dementia without behavioral disturbance: Secondary | ICD-10-CM | POA: Diagnosis present

## 2017-08-25 DIAGNOSIS — J309 Allergic rhinitis, unspecified: Secondary | ICD-10-CM | POA: Diagnosis present

## 2017-08-25 DIAGNOSIS — M810 Age-related osteoporosis without current pathological fracture: Secondary | ICD-10-CM | POA: Diagnosis present

## 2017-08-25 DIAGNOSIS — S51012A Laceration without foreign body of left elbow, initial encounter: Secondary | ICD-10-CM | POA: Diagnosis not present

## 2017-08-25 DIAGNOSIS — Z853 Personal history of malignant neoplasm of breast: Secondary | ICD-10-CM

## 2017-08-25 DIAGNOSIS — W050XXA Fall from non-moving wheelchair, initial encounter: Secondary | ICD-10-CM | POA: Diagnosis not present

## 2017-08-25 DIAGNOSIS — S72142A Displaced intertrochanteric fracture of left femur, initial encounter for closed fracture: Secondary | ICD-10-CM | POA: Diagnosis not present

## 2017-08-25 DIAGNOSIS — Z23 Encounter for immunization: Secondary | ICD-10-CM | POA: Diagnosis not present

## 2017-08-25 DIAGNOSIS — E785 Hyperlipidemia, unspecified: Secondary | ICD-10-CM | POA: Diagnosis present

## 2017-08-25 DIAGNOSIS — Z66 Do not resuscitate: Secondary | ICD-10-CM | POA: Diagnosis present

## 2017-08-25 DIAGNOSIS — S72002A Fracture of unspecified part of neck of left femur, initial encounter for closed fracture: Secondary | ICD-10-CM

## 2017-08-25 DIAGNOSIS — Y92099 Unspecified place in other non-institutional residence as the place of occurrence of the external cause: Secondary | ICD-10-CM

## 2017-08-25 DIAGNOSIS — G309 Alzheimer's disease, unspecified: Secondary | ICD-10-CM | POA: Diagnosis not present

## 2017-08-25 DIAGNOSIS — I10 Essential (primary) hypertension: Secondary | ICD-10-CM | POA: Diagnosis not present

## 2017-08-25 DIAGNOSIS — M1990 Unspecified osteoarthritis, unspecified site: Secondary | ICD-10-CM | POA: Diagnosis present

## 2017-08-25 DIAGNOSIS — M25552 Pain in left hip: Secondary | ICD-10-CM | POA: Diagnosis present

## 2017-08-25 DIAGNOSIS — M5136 Other intervertebral disc degeneration, lumbar region: Secondary | ICD-10-CM | POA: Diagnosis present

## 2017-08-25 DIAGNOSIS — E119 Type 2 diabetes mellitus without complications: Secondary | ICD-10-CM | POA: Diagnosis not present

## 2017-08-25 DIAGNOSIS — Z79899 Other long term (current) drug therapy: Secondary | ICD-10-CM

## 2017-08-25 DIAGNOSIS — W19XXXA Unspecified fall, initial encounter: Secondary | ICD-10-CM

## 2017-08-25 DIAGNOSIS — G8929 Other chronic pain: Secondary | ICD-10-CM | POA: Diagnosis present

## 2017-08-25 LAB — CREATININE, SERUM
Creatinine, Ser: 0.98 mg/dL (ref 0.44–1.00)
GFR calc Af Amer: >60 mL/min (ref >60)
GFR, EST NON AFRICAN AMERICAN: 52 mL/min — AB (ref >60)

## 2017-08-25 LAB — CBC
HCT: 40.5 % (ref 35.0–47.0)
Hemoglobin: 13.4 g/dL (ref 12.0–16.0)
MCH: 30.6 pg (ref 26.0–34.0)
MCHC: 33.1 g/dL (ref 32.0–36.0)
MCV: 92.4 fL (ref 80.0–100.0)
Platelets: 243 10*3/uL (ref 150–440)
RBC: 4.38 MIL/uL (ref 3.80–5.20)
RDW: 15 % — ABNORMAL HIGH (ref 11.5–14.5)
WBC: 18.5 10*3/uL — AB (ref 3.6–11.0)

## 2017-08-25 LAB — GLUCOSE, CAPILLARY: Glucose-Capillary: 166 mg/dL — ABNORMAL HIGH (ref 70–99)

## 2017-08-25 MED ORDER — FENTANYL CITRATE (PF) 100 MCG/2ML IJ SOLN: 25.0000 ug | INTRAMUSCULAR | Status: DC | PRN

## 2017-08-25 MED ORDER — LORAZEPAM 2 MG/ML IJ SOLN
0.5000 mg | Freq: Once | INTRAMUSCULAR | Status: AC
  Administered 2017-08-25: 0.5 mg via INTRAMUSCULAR
  Filled 2017-08-25: qty 1

## 2017-08-25 MED ORDER — LORAZEPAM 0.5 MG PO TABS: 0.5000 mg | ORAL_TABLET | ORAL | Status: DC | PRN

## 2017-08-25 MED ORDER — MORPHINE SULFATE (PF) 2 MG/ML IV SOLN: 2.0000 mg | INTRAVENOUS | Status: DC | PRN

## 2017-08-25 MED ORDER — ACETAMINOPHEN 650 MG RE SUPP: 650.0000 mg | Freq: Four times a day (QID) | RECTAL | Status: DC | PRN

## 2017-08-25 MED ORDER — OMEPRAZOLE 2 MG/ML ORAL SUSPENSION: 40.0000 mg | Freq: Every day | ORAL | Status: DC

## 2017-08-25 MED ORDER — INSULIN ASPART 100 UNIT/ML ~~LOC~~ SOLN: 0.0000 [IU] | Freq: Three times a day (TID) | SUBCUTANEOUS | Status: DC

## 2017-08-25 MED ORDER — OXYCODONE HCL 5 MG PO TABS: 5.0000 mg | ORAL_TABLET | ORAL | Status: DC | PRN

## 2017-08-25 MED ORDER — ACETAMINOPHEN 325 MG PO TABS
650.0000 mg | ORAL_TABLET | Freq: Four times a day (QID) | ORAL | Status: DC | PRN
  Filled 2017-08-25: qty 2

## 2017-08-25 MED ORDER — INSULIN ASPART 100 UNIT/ML ~~LOC~~ SOLN: 0.0000 [IU] | Freq: Every day | SUBCUTANEOUS | Status: DC

## 2017-08-25 MED ORDER — ONDANSETRON HCL 4 MG PO TABS: 4.0000 mg | ORAL_TABLET | Freq: Four times a day (QID) | ORAL | Status: DC | PRN

## 2017-08-25 MED ORDER — PANTOPRAZOLE SODIUM 40 MG PO PACK
40.0000 mg | PACK | Freq: Every day | ORAL | Status: DC
  Filled 2017-08-25: qty 20

## 2017-08-25 MED ORDER — ENOXAPARIN SODIUM 40 MG/0.4ML ~~LOC~~ SOLN
40.0000 mg | SUBCUTANEOUS | Status: DC
  Administered 2017-08-26: 40 mg via SUBCUTANEOUS
  Filled 2017-08-25: qty 0.4

## 2017-08-25 MED ORDER — TETANUS-DIPHTH-ACELL PERTUSSIS 5-2.5-18.5 LF-MCG/0.5 IM SUSP
0.5000 mL | Freq: Once | INTRAMUSCULAR | Status: AC
  Administered 2017-08-25: 0.5 mL via INTRAMUSCULAR
  Filled 2017-08-25: qty 0.5

## 2017-08-25 MED ORDER — ONDANSETRON HCL 4 MG/2ML IJ SOLN: 4.0000 mg | Freq: Four times a day (QID) | INTRAMUSCULAR | Status: DC | PRN

## 2017-08-25 MED ORDER — HYDROCODONE-ACETAMINOPHEN 7.5-325 MG/15ML PO SOLN
10.0000 mL | Freq: Once | ORAL | Status: AC
  Administered 2017-08-25: 10 mL via ORAL
  Filled 2017-08-25: qty 15

## 2017-08-25 NOTE — ED Provider Notes (Signed)
[facility] Emergency Department Provider Note  ____________________________________________  Time seen: Approximately 4:28 PM  I have reviewed the triage vital signs and the nursing notes.   HISTORY  Chief Complaint Fall  Level 5 caveat:  Portions of the history and physical were unable to be obtained due to dementia   HPI Sylvia Juarez is a 84 y.o. female with a history of dementia presents after falling from her wheelchair.  Fall was witnessed and mechanical in nature.  Unclear if patient hit her head.  No LOC.  Patient is holding her left hip and will not straighten her leg.  She will complain of pain when we try to do it. Also holding her L rib cage. Patient is confused and at baseline. She does remember falling.    Past Medical History:  Diagnosis Date  . Abnormal ECG    a. 05/2012 TWI in III, V1-V2.  Marland Kitchen Allergic rhinitis due to other allergen   . Arthritis   . Chest pain    a. 05/2012 Lexiscan MV: EF >65%, no ischemia/infarct.  . Degenerative joint disease of low back    a. With chronic back pain.  . Dementia   . History of tobacco abuse   . Hyperlipidemia   . Hypertension   . Malignant neoplasm of breast (female), unspecified site   . Osteoporosis, unspecified   . Peripheral Vascular Screening    a. 05/2012 ABI: R: 1.1, L: 1.1;  b. 05/2012 Carotid U/S: no hemodynamically significant stenosis bilat.  Marland Kitchen Spontaneous ecchymoses   . Type II or unspecified type diabetes mellitus without mention of complication, not stated as uncontrolled     Patient Active Problem List   Diagnosis Date Noted  . Diabetes ([place]) 08/25/2017  . Dementia 08/25/2017  . Closed left hip fracture ([place]) 08/25/2017  . GI bleed 07/09/2017  . GIB (gastrointestinal bleeding) 07/08/2017  . Nonexudative age-related macular degeneration 12/04/2014  . Benign essential HTN 12/04/2014  . History of malignant neoplasm of female breast 12/04/2014  . Lumbosacral  spondylosis 12/04/2014  . Dyslipidemia 12/04/2014  . Allergic state 12/04/2014  . H/O peptic ulcer 12/04/2014  . Bad memory 12/04/2014  . OP (osteoporosis) 12/04/2014  . Chest pain 05/28/2012  . Claudication ([place]) 05/28/2012    Past Surgical History:  Procedure Laterality Date  . BREAST BIOPSY Right   . CATARACT EXTRACTION, BILATERAL    . MASTECTOMY    . NEPHRECTOMY    . TOTAL ABDOMINAL HYSTERECTOMY      Prior to Admission medications   Medication Sig Start Date End Date Taking? Authorizing Provider  hyoscyamine (ANASPAZ) 0.125 MG TBDP disintergrating tablet Place 0.125 mg under the tongue every 4 (four) hours as needed (increased secreations).   Yes [provider]  LORazepam (ATIVAN) 0.5 MG tablet Take 0.5 mg by mouth every 4 (four) hours as needed for anxiety (agitation).   Yes [provider]  morphine (ROXANOL) 20 MG/ML concentrated solution Take 5 mg by mouth every 2 (two) hours as needed for severe pain (dyspnea).   Yes [provider]  NONFORMULARY OR COMPOUNDED ITEM Apply 0.5 mg topically every Monday, Wednesday, and Friday. Lorazepam 0.5mg /0.15ml gel- Apply 0.5 mg topically 30 minutes prior to shower or podiatry exam. *Maximum one dose per day*   Yes [provider]  omeprazole (PRILOSEC) 2 mg/mL SUSP Take 40 mg by mouth daily.   Yes [provider]  ondansetron (ZOFRAN) 4 MG tablet Take 4 mg by mouth every 8 (eight)  hours as needed for nausea (anxiety).   Yes [provider]  pantoprazole (PROTONIX) 40 MG tablet Take 1 tablet (40 mg total) by mouth daily. Patient not taking: Reported on 08/25/2017 07/09/17   Bettey Costa, MD    Allergies Actonel [risedronate sodium] and Prednisone  Family History  Problem Relation Age of Onset  . Dementia Sister     Social History Social History   Tobacco Use  . Smoking status: Former Smoker    Years: 0.00    Types: Cigarettes  . Smokeless tobacco: Never Used  Substance Use  Topics  . Alcohol use: No    Alcohol/week: 0.0 oz  . Drug use: No    Review of Systems Respiratory: + L chest wall pain Musculoskeletal: + Left hip pain Skin: + L elbow skin tear  ____________________________________________   PHYSICAL EXAM:  VITAL SIGNS: ED Triage Vitals  Enc Vitals Group     BP 08/25/17 1621 (!) 162/106     Pulse Rate 08/25/17 1621 (!) 58     Resp 08/25/17 1621 18     Temp 08/25/17 1621 97.8 F (36.6 C)     Temp Source 08/25/17 1621 Oral     SpO2 08/25/17 1621 95 %     Weight 08/25/17 1624 100 lb (45.4 kg)     Height 08/25/17 1624 5' (1.524 m)     Head Circumference --      Peak Flow --      Pain Score 08/25/17 1623 6     Pain Loc --      Pain Edu? --      Excl. in [place]? --    Full spinal precautions maintained throughout the trauma exam. Constitutional: Alert and oriented x 1. No acute distress. Does not appear intoxicated. HEENT Head: Normocephalic and atraumatic. Face: No facial bony tenderness. Stable midface Ears: No hemotympanum bilaterally. No Battle sign Eyes: No eye injury. PERRL. No raccoon eyes Nose: Nontender. No epistaxis. No rhinorrhea Mouth/Throat: Mucous membranes are moist. No oropharyngeal blood. No dental injury. Airway patent without stridor. Normal voice. Neck: no C-collar in place. No midline c-spine tenderness.  Cardiovascular: Normal rate, regular rhythm. Normal and symmetric distal pulses are present in all extremities. Pulmonary/Chest: Chest wall is stable, tenderness to palpation over the left anterior chest wall. Normal respiratory effort. Breath sounds are normal. No crepitus.  Abdominal: Soft, nontender, non distended. Musculoskeletal: Patient keeps her left leg flexed and complains of pain when we try to extended, she is also tender to palpation over the proximal femur area on the left. nontender with normal full range of motion in all other extremities. No deformities. No thoracic or lumbar midline spinal tenderness.  Pelvis is stable. Skin: Skin is warm, dry and intact. Skin tear on the L elbow Psychiatric: Speech and behavior are appropriate. Neurological: Normal speech and language. Moves all extremities to command. No gross focal neurologic deficits are appreciated.  Glascow Coma Score: 4 - Opens eyes on own 6 - Follows simple motor commands 4 - Seems confused, disoriented GCS: 14   ____________________________________________   LABS (all labs ordered are listed, but only abnormal results are displayed)  Labs Reviewed - No data to display ____________________________________________  EKG  none  ____________________________________________  RADIOLOGY  I have personally reviewed the images performed during this visit and I agree with the Radiologist's read.   Interpretation by Radiologist:  Dg Ribs Unilateral W/chest Left  Result Date: 08/25/2017 CLINICAL DATA:  Patient poor historian at this time. Encounter for  fall, left hip and rib pain. Unable to obtain further hx at this time. EXAM: LEFT RIBS AND CHEST - 3+ VIEW COMPARISON:  07/07/2017 FINDINGS: No rib fracture or rib lesion. Cardiac silhouette is normal in size. No mediastinal or hilar masses. Mild scarring in the upper lobes, stable. No evidence of pneumonia or pulmonary edema. No pleural effusion or pneumothorax. IMPRESSION: 1. No rib fracture or rib lesion. 2. No acute cardiopulmonary disease. Electronically Signed   By: Lajean Manes M.D.   On: 08/25/2017 17:58   Ct Head Wo Contrast  Result Date: 08/25/2017 CLINICAL DATA:  Per nh pt fell out of her w/c - has skin tear to left elbow. C/o left hip/pelvic pain. Pt has hx of dementia and is a poor historian EXAM: CT HEAD WITHOUT CONTRAST TECHNIQUE: Contiguous axial images were obtained from the base of the skull through the vertex without intravenous contrast. COMPARISON:  06/22/2017 FINDINGS: Brain: No evidence of acute infarction, hemorrhage, hydrocephalus, extra-axial collection or  mass lesion/mass effect. There is ventricular and sulcal enlargement reflecting moderate to marked atrophy. White matter hypoattenuation is noted bilaterally consistent with extensive chronic microvascular ischemic change. These findings are stable from the prior CT. Vascular: No hyperdense vessel or unexpected calcification. Skull: Normal. Negative for fracture or focal lesion. Sinuses/Orbits: Visualized globes and orbits are unremarkable. Visualized sinuses and mastoid air cells are clear. Other: None. IMPRESSION: 1. No acute intracranial abnormalities. 2. Advanced atrophy and chronic microvascular ischemic change stable from the prior head CT. Electronically Signed   By: Lajean Manes M.D.   On: 08/25/2017 18:01   Dg Hip Unilat W Or Wo Pelvis 2-3 Views Left  Result Date: 08/25/2017 CLINICAL DATA:  84 year old female with history of fall complaining of left hip and rib pain. EXAM: DG HIP (WITH OR WITHOUT PELVIS) 2-3V LEFT COMPARISON:  No priors. FINDINGS: Acute mildly displaced intertrochanteric left hip fracture with mild varus angulation. Left femoral head remains located. Bony pelvis appears intact. Visualized portions of the right proximal femur also appear intact. Joint space narrowing, subchondral sclerosis and osteophyte formation in the hip joints bilaterally, indicative of osteoarthritis. Surgical clips projecting over the lower right abdomen. IMPRESSION: Acute mildly displaced left-sided intertrochanteric hip fracture with mild varus angulation. Electronically Signed   By: Vinnie Langton M.D.   On: 08/25/2017 17:58      ____________________________________________   PROCEDURES  Procedure(s) performed: None Procedures Critical Care performed:  None ____________________________________________   INITIAL IMPRESSION / ASSESSMENT AND PLAN / ED COURSE   84 y.o. female with a history of dementia presents after falling from her wheelchair.  We will get a head CT to rule out intracranial  injury.  There is no CT and L-spine tenderness.  X-ray of the pelvis including left hip and chest including rib view are pending.  Skin tear has been dressed.  Tetanus will be given.  Clinical Course as of Aug 26 2110  Sat Aug 25, 2017  2017 She sustained a hip fracture.  Her son and daughter-in-law are her closest relatives and decided not to pursue surgical management since patient has advanced dementia.  They just want patient to be comfortable and sent back to her facility.  We spoke with hospice and they are able to follow patient up at [facility].  She already has morphine prescribed for her that is administered as needed and [facility].  I spoke with [facility] and I am waiting to hear back from the supervisor to see if they are comfortable  accepting patient back.  Patient will be held in the emergency room and to get confirmation from the facility.   [CV]    Clinical Course User Index [CV] Alfred Levins [place], MD   _________________________ 9:10 PM on 08/25/2017 -----------------------------------------  [facility] does not feel comfortable accepting back at this time. Spoke with the Hospitalist for admission for pain control and SW consult for placement.  Son has been working with [facility] to see if they will accept her.  As part of my medical decision making, I reviewed the following data within the [place] notes reviewed and incorporated, Radiograph reviewed , Notes from prior ED visits and [place] Controlled Substance Database    Pertinent labs & imaging results that were available during my care of the patient were reviewed by me and considered in my medical decision making (see chart for details).    ____________________________________________   FINAL CLINICAL IMPRESSION(S) / ED DIAGNOSES  Final diagnoses:  Fall, initial encounter  Closed fracture of left hip, initial encounter ([facility])  Skin tear of left elbow without complication, initial  encounter      NEW MEDICATIONS STARTED DURING THIS VISIT:  ED Discharge Orders    None       Note:  This document was prepared using Dragon voice recognition software and may include unintentional dictation errors.    Rudene Re, MD 08/25/17 2112

## 2017-08-25 NOTE — H&P (Signed)
[facility] NAME: Sylvia Juarez    MR#:  [id number]  DATE OF BIRTH:  07/11/1932  DATE OF ADMISSION:  08/25/2017  PRIMARY CARE PHYSICIAN: [facility]   REQUESTING/REFERRING PHYSICIAN: Alfred Levins, MD  CHIEF COMPLAINT:   Chief Complaint  Patient presents with  . Fall    HISTORY OF PRESENT ILLNESS:  Sylvia Juarez  is a 84 y.o. female who presents with fall at her nursing facility and subsequent left hip fracture.  Patient is DNR DNI, and family does not wish to pursue operative repair as the patient is in a memory care unit and already has minimal function at baseline.  However, nursing facility states that they are unable to take the patient back at this time as they are not able to perform some assessment that they need to do so until Monday.  Hospitalist were called for admission  PAST MEDICAL HISTORY:   Past Medical History:  Diagnosis Date  . Abnormal ECG    a. 05/2012 TWI in III, V1-V2.  Marland Kitchen Allergic rhinitis due to other allergen   . Arthritis   . Chest pain    a. 05/2012 Lexiscan MV: EF >65%, no ischemia/infarct.  . Degenerative joint disease of low back    a. With chronic back pain.  . Dementia   . History of tobacco abuse   . Hyperlipidemia   . Hypertension   . Malignant neoplasm of breast (female), unspecified site   . Osteoporosis, unspecified   . Peripheral Vascular Screening    a. 05/2012 ABI: R: 1.1, L: 1.1;  b. 05/2012 Carotid U/S: no hemodynamically significant stenosis bilat.  Marland Kitchen Spontaneous ecchymoses   . Type II or unspecified type diabetes mellitus without mention of complication, not stated as uncontrolled      PAST SURGICAL HISTORY:   Past Surgical History:  Procedure Laterality Date  . BREAST BIOPSY Right   . CATARACT EXTRACTION, BILATERAL    . MASTECTOMY    . NEPHRECTOMY    . TOTAL ABDOMINAL HYSTERECTOMY       SOCIAL HISTORY:   Social History   Tobacco  Use  . Smoking status: Former Smoker    Years: 0.00    Types: Cigarettes  . Smokeless tobacco: Never Used  Substance Use Topics  . Alcohol use: No    Alcohol/week: 0.0 oz     FAMILY HISTORY:   Family History  Problem Relation Age of Onset  . Dementia Sister      DRUG ALLERGIES:   Allergies  Allergen Reactions  . Actonel [Risedronate Sodium]     Unknown reaction  . Prednisone Other (See Comments)    MEDICATIONS AT HOME:   Prior to Admission medications   Medication Sig Start Date End Date Taking? Authorizing Provider  hyoscyamine (ANASPAZ) 0.125 MG TBDP disintergrating tablet Place 0.125 mg under the tongue every 4 (four) hours as needed (increased secreations).   Yes [provider]  LORazepam (ATIVAN) 0.5 MG tablet Take 0.5 mg by mouth every 4 (four) hours as needed for anxiety (agitation).   Yes [provider]  morphine (ROXANOL) 20 MG/ML concentrated solution Take 5 mg by mouth every 2 (two) hours as needed for severe pain (dyspnea).   Yes [provider]  NONFORMULARY OR COMPOUNDED ITEM Apply 0.5 mg topically every Monday, Wednesday, and Friday. Lorazepam 0.5mg /0.79ml gel- Apply 0.5 mg topically 30 minutes prior to shower or podiatry exam. *Maximum one dose per day*   Yes  [provider]  omeprazole (PRILOSEC) 2 mg/mL SUSP Take 40 mg by mouth daily.   Yes [provider]  ondansetron (ZOFRAN) 4 MG tablet Take 4 mg by mouth every 8 (eight) hours as needed for nausea (anxiety).   Yes [provider]  pantoprazole (PROTONIX) 40 MG tablet Take 1 tablet (40 mg total) by mouth daily. Patient not taking: Reported on 08/25/2017 07/09/17   Bettey Costa, MD    REVIEW OF SYSTEMS:  Review of Systems  Unable to perform ROS: Dementia     VITAL SIGNS:   Vitals:   08/25/17 1821 08/25/17 1830 08/25/17 1900 08/25/17 1930  BP: (!) 146/116 117/87 127/84 114/75  Pulse: 100     Resp:      Temp:      TempSrc:      SpO2: 97%      Weight:      Height:       Wt Readings from Last 3 Encounters:  08/25/17 45.4 kg (100 lb)  07/08/17 45.1 kg (99 lb 8 oz)  06/22/17 45.4 kg (100 lb)    PHYSICAL EXAMINATION:  Physical Exam  Vitals reviewed. Constitutional: She appears well-developed and well-nourished. No distress.  HENT:  Head: Normocephalic and atraumatic.  Mouth/Throat: Oropharynx is clear and moist.  Eyes: Pupils are equal, round, and reactive to light. Conjunctivae and EOM are normal. No scleral icterus.  Neck: Normal range of motion. Neck supple. No JVD present. No thyromegaly present.  Cardiovascular: Normal rate, regular rhythm and intact distal pulses. Exam reveals no gallop and no friction rub.  No murmur heard. Respiratory: Effort normal and breath sounds normal. No respiratory distress. She has no wheezes. She has no rales.  GI: Soft. Bowel sounds are normal. She exhibits no distension. There is no tenderness.  Musculoskeletal: Normal range of motion. She exhibits no edema.  No arthritis, no gout  Lymphadenopathy:    She has no cervical adenopathy.  Neurological:  Unable to assess due to dementia  Skin: Skin is warm and dry. No rash noted. No erythema.  Psychiatric:  Unable to assess due to dementia    LABORATORY PANEL:   CBC No results for input(s): WBC, HGB, HCT, PLT in the last 168 hours. ------------------------------------------------------------------------------------------------------------------  Chemistries  No results for input(s): NA, K, CL, CO2, GLUCOSE, BUN, CREATININE, CALCIUM, MG, AST, ALT, ALKPHOS, BILITOT in the last 168 hours.  Invalid input(s): GFRCGP ------------------------------------------------------------------------------------------------------------------  Cardiac Enzymes No results for input(s): TROPONINI in the last 168 hours. ------------------------------------------------------------------------------------------------------------------  RADIOLOGY:  Dg  Ribs Unilateral W/chest Left  Result Date: 08/25/2017 CLINICAL DATA:  Patient poor historian at this time. Encounter for fall, left hip and rib pain. Unable to obtain further hx at this time. EXAM: LEFT RIBS AND CHEST - 3+ VIEW COMPARISON:  07/07/2017 FINDINGS: No rib fracture or rib lesion. Cardiac silhouette is normal in size. No mediastinal or hilar masses. Mild scarring in the upper lobes, stable. No evidence of pneumonia or pulmonary edema. No pleural effusion or pneumothorax. IMPRESSION: 1. No rib fracture or rib lesion. 2. No acute cardiopulmonary disease. Electronically Signed   By: Lajean Manes M.D.   On: 08/25/2017 17:58   Ct Head Wo Contrast  Result Date: 08/25/2017 CLINICAL DATA:  Per nh pt fell out of her w/c - has skin tear to left elbow. C/o left hip/pelvic pain. Pt has hx of dementia and is a poor historian EXAM: CT HEAD WITHOUT CONTRAST TECHNIQUE: Contiguous axial images were obtained from the base of the skull  through the vertex without intravenous contrast. COMPARISON:  06/22/2017 FINDINGS: Brain: No evidence of acute infarction, hemorrhage, hydrocephalus, extra-axial collection or mass lesion/mass effect. There is ventricular and sulcal enlargement reflecting moderate to marked atrophy. White matter hypoattenuation is noted bilaterally consistent with extensive chronic microvascular ischemic change. These findings are stable from the prior CT. Vascular: No hyperdense vessel or unexpected calcification. Skull: Normal. Negative for fracture or focal lesion. Sinuses/Orbits: Visualized globes and orbits are unremarkable. Visualized sinuses and mastoid air cells are clear. Other: None. IMPRESSION: 1. No acute intracranial abnormalities. 2. Advanced atrophy and chronic microvascular ischemic change stable from the prior head CT. Electronically Signed   By: Lajean Manes M.D.   On: 08/25/2017 18:01   Dg Hip Unilat W Or Wo Pelvis 2-3 Views Left  Result Date: 08/25/2017 CLINICAL DATA:   84 year old female with history of fall complaining of left hip and rib pain. EXAM: DG HIP (WITH OR WITHOUT PELVIS) 2-3V LEFT COMPARISON:  No priors. FINDINGS: Acute mildly displaced intertrochanteric left hip fracture with mild varus angulation. Left femoral head remains located. Bony pelvis appears intact. Visualized portions of the right proximal femur also appear intact. Joint space narrowing, subchondral sclerosis and osteophyte formation in the hip joints bilaterally, indicative of osteoarthritis. Surgical clips projecting over the lower right abdomen. IMPRESSION: Acute mildly displaced left-sided intertrochanteric hip fracture with mild varus angulation. Electronically Signed   By: Vinnie Langton M.D.   On: 08/25/2017 17:58    EKG:   Orders placed or performed during the hospital encounter of 08/25/17  . ED EKG  . 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 Labs, imaging and/or ECG reviewed by provider and discussed with patient/family. Management plans discussed with the patient and/or family.  DVT PROPHYLAXIS: SubQ lovenox  GI PROPHYLAXIS: None  ADMISSION STATUS: Inpatient  CODE STATUS: Full Code Status History    Date Active Date Inactive Code Status Order ID Comments User Context   07/08/2017 0238 07/09/2017 1814 DNR [id number]  Arta Silence, MD Inpatient   07/08/2017 0209 07/08/2017 0238 Full Code [id number]  Amelia Jo, MD Inpatient    Questions for Most Recent Historical Code  Status (Order [id number])    Question Answer Comment   In the event of cardiac or respiratory ARREST Do not call a "code blue"    In the event of cardiac or respiratory ARREST Do not perform Intubation, CPR, defibrillation or ACLS    In the event of cardiac or respiratory ARREST Use medication by any route, position, wound care, and other measures to relive pain and suffering. May use oxygen, suction and manual treatment of airway obstruction as needed for comfort.       TOTAL TIME TAKING CARE OF THIS PATIENT: 45 minutes.   Sylvia Juarez [place] 08/25/2017, 9:25 PM  [employer]  [phone]  CC: Primary care physician; [facility]  Note:  This document was prepared using Dragon voice recognition software and may include unintentional dictation errors.

## 2017-08-25 NOTE — Progress Notes (Signed)
Unable to complete patient's admission history due to patient cognitive limitation. Will continue to monitor and endorse.

## 2017-08-25 NOTE — ED Notes (Signed)
Per family pt spit out some of the pain medication after this nurse left the room. Iv and pain meds ordered for admission.

## 2017-08-25 NOTE — ED Triage Notes (Signed)
Per [facility] pt fell out of her w/c - has skin tear to left elbow. C/o left hip/pelvic pain. Pt has hx of dementia and is a poor historian

## 2017-08-25 NOTE — ED Notes (Signed)
Report to [facility]

## 2017-08-25 NOTE — ED Notes (Signed)
[facility] is unable to care for pt with broken hip. Pt to be admitted and nh arrangements to be made.

## 2017-08-25 NOTE — ED Notes (Signed)
Hospice called the family back (not me) and told them that the only way they take pts at the hospice facility is if the pt only has 2 weeks to live. Their suggestion was to check with [facility] to see if they with hospice could care for the pt. Call out to [facility] to discuss this with them.

## 2017-08-25 NOTE — ED Notes (Signed)
Dr speaking with [facility].

## 2017-08-26 DIAGNOSIS — W050XXA Fall from non-moving wheelchair, initial encounter: Secondary | ICD-10-CM | POA: Diagnosis present

## 2017-08-26 DIAGNOSIS — M25552 Pain in left hip: Secondary | ICD-10-CM | POA: Diagnosis present

## 2017-08-26 DIAGNOSIS — S72142A Displaced intertrochanteric fracture of left femur, initial encounter for closed fracture: Secondary | ICD-10-CM | POA: Diagnosis not present

## 2017-08-26 DIAGNOSIS — Y92099 Unspecified place in other non-institutional residence as the place of occurrence of the external cause: Secondary | ICD-10-CM | POA: Diagnosis not present

## 2017-08-26 DIAGNOSIS — Z23 Encounter for immunization: Secondary | ICD-10-CM | POA: Diagnosis not present

## 2017-08-26 LAB — CBC
HEMATOCRIT: 36.6 % (ref 35.0–47.0)
HEMOGLOBIN: 12.2 g/dL (ref 12.0–16.0)
MCH: 30.9 pg (ref 26.0–34.0)
MCHC: 33.4 g/dL (ref 32.0–36.0)
MCV: 92.6 fL (ref 80.0–100.0)
Platelets: 242 10*3/uL (ref 150–440)
RBC: 3.96 MIL/uL (ref 3.80–5.20)
RDW: 14.7 % — ABNORMAL HIGH (ref 11.5–14.5)
WBC: 15.1 10*3/uL — AB (ref 3.6–11.0)

## 2017-08-26 LAB — GLUCOSE, CAPILLARY
GLUCOSE-CAPILLARY: 115 mg/dL — AB (ref 70–99)
Glucose-Capillary: 111 mg/dL — ABNORMAL HIGH (ref 70–99)

## 2017-08-26 LAB — BASIC METABOLIC PANEL
Anion gap: 5 (ref 5–15)
BUN: 19 mg/dL (ref 8–23)
CHLORIDE: 108 mmol/L (ref 98–111)
CO2: 27 mmol/L (ref 22–32)
Calcium: 8.7 mg/dL — ABNORMAL LOW (ref 8.9–10.3)
Creatinine, Ser: 1.01 mg/dL — ABNORMAL HIGH (ref 0.44–1.00)
GFR calc non Af Amer: 50 mL/min — ABNORMAL LOW (ref >60)
GFR, EST AFRICAN AMERICAN: 58 mL/min — AB (ref >60)
Glucose, Bld: 129 mg/dL — ABNORMAL HIGH (ref 70–99)
POTASSIUM: 4.2 mmol/L (ref 3.5–5.1)
SODIUM: 140 mmol/L (ref 135–145)

## 2017-08-26 LAB — MRSA PCR SCREENING: MRSA by PCR: NEGATIVE

## 2017-08-26 MED ORDER — MORPHINE SULFATE (PF) 2 MG/ML IV SOLN
1.0000 mg | Freq: Once | INTRAVENOUS | Status: AC
  Administered 2017-08-26: 1 mg via INTRAVENOUS
  Filled 2017-08-26: qty 1

## 2017-08-26 MED ORDER — MORPHINE SULFATE (CONCENTRATE) 20 MG/ML PO SOLN: 10.0000 mg | ORAL | 0 refills | Status: AC | PRN

## 2017-09-13 NOTE — Progress Notes (Signed)
[facility] [place] NAME: Sylvia Juarez    MR#:  [id number]  DATE OF BIRTH:  1932/07/05  SUBJECTIVE:  CHIEF COMPLAINT:   Chief Complaint  Patient presents with  . Fall   -Admitted with left hip fracture, severely demented.  Wincing in pain when touched  REVIEW OF SYSTEMS:  Review of Systems  Unable to perform ROS: Dementia    DRUG ALLERGIES:   Allergies  Allergen Reactions  . Actonel [Risedronate Sodium]     Unknown reaction  . Prednisone Other (See Comments)    VITALS:  Blood pressure (!) 109/56, pulse (!) 105, temperature 98.8 F (37.1 C), temperature source Axillary, resp. rate 16, height 5' (1.524 m), weight 45.4 kg (100 lb), SpO2 96 %.  PHYSICAL EXAMINATION:  Physical Exam  GENERAL:  84 y.o.-year-old elderly patient lying in the bed, wincing in pain when touched. EYES: Pupils equal, round, reactive to light and accommodation. No scleral icterus. Extraocular muscles intact.  HEENT: Head atraumatic, normocephalic. Oropharynx and nasopharynx clear.  NECK:  Supple, no jugular venous distention. No thyroid enlargement, no tenderness.  LUNGS: Normal breath sounds bilaterally, no wheezing, rales,rhonchi or crepitation. No use of accessory muscles of respiration. decreased bibasilar breath sounds  CARDIOVASCULAR: S1, S2 normal. No rubs, or gallops. 2/6 systolic murmur present. ABDOMEN: Soft, nontender, nondistended. Bowel sounds present. No organomegaly or mass.  EXTREMITIES: No pedal edema, cyanosis, or clubbing.  NEUROLOGIC: cranial nerves intact, not following commands, actually left hip is flexed at hip and knee.  PSYCHIATRIC: The patient is alert and disoriented  SKIN: No obvious rash, lesion, or ulcer.    LABORATORY PANEL:   CBC Recent Labs  Lab 16-Sep-2017 0502  WBC 15.1*  HGB 12.2  HCT 36.6  PLT 242    ------------------------------------------------------------------------------------------------------------------  Chemistries  Recent Labs  Lab 2017/09/16 0502  NA 140  K 4.2  CL 108  CO2 27  GLUCOSE 129*  BUN 19  CREATININE 1.01*  CALCIUM 8.7*   ------------------------------------------------------------------------------------------------------------------  Cardiac Enzymes No results for input(s): TROPONINI in the last 168 hours. ------------------------------------------------------------------------------------------------------------------  RADIOLOGY:  Dg Ribs Unilateral W/chest Left  Result Date: 08/25/2017 CLINICAL DATA:  Patient poor historian at this time. Encounter for fall, left hip and rib pain. Unable to obtain further hx at this time. EXAM: LEFT RIBS AND CHEST - 3+ VIEW COMPARISON:  07/07/2017 FINDINGS: No rib fracture or rib lesion. Cardiac silhouette is normal in size. No mediastinal or hilar masses. Mild scarring in the upper lobes, stable. No evidence of pneumonia or pulmonary edema. No pleural effusion or pneumothorax. IMPRESSION: 1. No rib fracture or rib lesion. 2. No acute cardiopulmonary disease. Electronically Signed   By: Lajean Manes M.D.   On: 08/25/2017 17:58   Ct Head Wo Contrast  Result Date: 08/25/2017 CLINICAL DATA:  Per nh pt fell out of her w/c - has skin tear to left elbow. C/o left hip/pelvic pain. Pt has hx of dementia and is a poor historian EXAM: CT HEAD WITHOUT CONTRAST TECHNIQUE: Contiguous axial images were obtained from the base of the skull through the vertex without intravenous contrast. COMPARISON:  06/22/2017 FINDINGS: Brain: No evidence of acute infarction, hemorrhage, hydrocephalus, extra-axial collection or mass lesion/mass effect. There is ventricular and sulcal enlargement reflecting moderate to marked atrophy. White matter hypoattenuation is noted bilaterally consistent with extensive chronic microvascular ischemic change. These  findings are stable from the prior CT. Vascular: No hyperdense vessel or unexpected calcification. Skull: Normal. Negative for  fracture or focal lesion. Sinuses/Orbits: Visualized globes and orbits are unremarkable. Visualized sinuses and mastoid air cells are clear. Other: None. IMPRESSION: 1. No acute intracranial abnormalities. 2. Advanced atrophy and chronic microvascular ischemic change stable from the prior head CT. Electronically Signed   By: Lajean Manes M.D.   On: 08/25/2017 18:01   Dg Hip Unilat W Or Wo Pelvis 2-3 Views Left  Result Date: 08/25/2017 CLINICAL DATA:  84 year old female with history of fall complaining of left hip and rib pain. EXAM: DG HIP (WITH OR WITHOUT PELVIS) 2-3V LEFT COMPARISON:  No priors. FINDINGS: Acute mildly displaced intertrochanteric left hip fracture with mild varus angulation. Left femoral head remains located. Bony pelvis appears intact. Visualized portions of the right proximal femur also appear intact. Joint space narrowing, subchondral sclerosis and osteophyte formation in the hip joints bilaterally, indicative of osteoarthritis. Surgical clips projecting over the lower right abdomen. IMPRESSION: Acute mildly displaced left-sided intertrochanteric hip fracture with mild varus angulation. Electronically Signed   By: Vinnie Langton M.D.   On: 08/25/2017 17:58    EKG:   Orders placed or performed during the hospital encounter of 08/25/17  . ED EKG  . ED EKG    ASSESSMENT AND PLAN:   84 year old female with past medical history significant for Alzheimer's dementia, arthritis, hypertension, osteoporosis presents from an assisted living facility after a fall and left hip fracture  1.  Left hip fracture-secondary to a fall.  X-ray showing acute intertrochanteric hip fracture on the left side -With her underlying dementia and poor performance status, family has opted for hospice and no surgery -Patient is already followed by [facility]. Continue keeping  her comfortable with pain medications. -Social worker involved for disposition plans as patient cannot go back to assisted living facility. -Check urine analysis to see if she has UTI  2.  Hypertension-blood pressure is reasonably well controlled.  Monitor  3.  GERD-Protonix  4.  DVT prophylaxis-on Lovenox    All the records are reviewed and case discussed with Care Management/Social Workerr. Management plans discussed with the patient, family and they are in agreement.  CODE STATUS: DNR  TOTAL TIME TAKING CARE OF THIS PATIENT: 38 minutes.   POSSIBLE D/C IN 2 DAYS, DEPENDING ON CLINICAL CONDITION.   Gladstone Lighter M.D on 18-Sep-2017 at 11:06 AM  Between 7am to 6pm - Pager - [phone]  After 6pm go to [email or web address] - Proofreader  [facility]  Office  [phone]  CC: Primary care physician; [facility]

## 2017-09-13 NOTE — Progress Notes (Signed)
EMS here to transport pt. 

## 2017-09-13 NOTE — Discharge Summary (Signed)
[facility] [place] NAME: Sylvia Juarez    MR#:  [id number]  DATE OF BIRTH:  [birth year]  DATE OF ADMISSION:  08/25/2017   ADMITTING PHYSICIAN: Lance Coon, MD  DATE OF DISCHARGE:  09/25/2017  PRIMARY CARE PHYSICIAN: [facility]   ADMISSION DIAGNOSIS:   Closed fracture of left hip, initial encounter ([place]) [S72.002A] Fall, initial encounter [W19.XXXA] Skin tear of left elbow without complication, initial encounter [S51.012A]  DISCHARGE DIAGNOSIS:   Principal Problem:   Closed left hip fracture (HCC) Active Problems:   Benign essential HTN   Dyslipidemia   Diabetes ([place])   Dementia   SECONDARY DIAGNOSIS:   Past Medical History:  Diagnosis Date  . Abnormal ECG    a. 05/2012 TWI in III, V1-V2.  Marland Kitchen Allergic rhinitis due to other allergen   . Arthritis   . Chest pain    a. 05/2012 Lexiscan MV: EF >65%, no ischemia/infarct.  . Degenerative joint disease of low back    a. With chronic back pain.  . Dementia   . History of tobacco abuse   . Hyperlipidemia   . Hypertension   . Malignant neoplasm of breast (female), unspecified site   . Osteoporosis, unspecified   . Peripheral Vascular Screening    a. 05/2012 ABI: R: 1.1, L: 1.1;  b. 05/2012 Carotid U/S: no hemodynamically significant stenosis bilat.  Marland Kitchen Spontaneous ecchymoses   . Type II or unspecified type diabetes mellitus without mention of complication, not stated as uncontrolled     HOSPITAL COURSE:   84 year old female with past medical history significant for Alzheimer's dementia, arthritis, hypertension, osteoporosis presents from an assisted living facility after a fall and left hip fracture  1.  Left hip fracture-secondary to a fall.  X-ray showing acute intertrochanteric hip fracture on the left side -With her underlying dementia and poor performance status, family has opted for hospice and no surgery -Patient is already followed by [facility]. Continue keeping her comfortable with pain medications. -Patient can be discharged to [facility]osp[facility] home with [facility]  2.  Hypertension-blood pressure is reasonably well controlled.    3.  GERD-prilosec  Can be discharged to hospice home today   DISCHARGE CONDITIONS:   Critical  CONSULTS OBTAINED:   None  DRUG ALLERGIES:   Allergies  Allergen Reactions  . Actonel [Risedronate Sodium]     Unknown reaction  . Prednisone Other (See Comments)   DISCHARGE MEDICATIONS:   Allergies as of 2017-09-25      Reactions   Actonel [risedronate Sodium]    Unknown reaction   Prednisone Other (See Comments)      Medication List    STOP taking these medications   NONFORMULARY OR COMPOUNDED ITEM   pantoprazole 40 MG tablet Commonly known as:  PROTONIX     TAKE these medications   hyoscyamine 0.125 MG Tbdp disintergrating tablet Commonly known as:  ANASPAZ Place 0.125 mg under the tongue every 4 (four) hours as needed (increased secreations).   LORazepam 0.5 MG tablet Commonly known as:  ATIVAN Take 0.5 mg by mouth every 4 (four) hours as needed for anxiety (agitation).   morphine 20 MG/ML concentrated solution Commonly known as:  ROXANOL Take 0.5 mLs (10 mg total) by mouth every 2 (two) hours as needed for severe pain (dyspnea). What changed:  how much to take   omeprazole 2 mg/mL Susp Commonly known as:  PRILOSEC Take 40 mg by mouth daily.   ondansetron 4 MG  tablet Commonly known as:  ZOFRAN Take 4 mg by mouth every 8 (eight) hours as needed for nausea (anxiety).        DISCHARGE INSTRUCTIONS:   1. Discharged to [facility] home  DIET:   As tolerated  ACTIVITY:   Activity as tolerated  OXYGEN:   Home Oxygen: No.  Oxygen Delivery: room air  DISCHARGE LOCATION:   Hospice home   If you experience worsening of your admission symptoms, develop shortness of breath, life threatening emergency, suicidal or homicidal thoughts you must seek medical  attention immediately by calling 911 or calling your MD immediately  if symptoms less severe.  You Must read complete instructions/literature along with all the possible adverse reactions/side effects for all the Medicines you take and that have been prescribed to you. Take any new Medicines after you have completely understood and accpet all the possible adverse reactions/side effects.   Please note  You were cared for by a hospitalist during your hospital stay. If you have any questions about your discharge medications or the care you received while you were in the hospital after you are discharged, you can call the unit and asked to speak with the hospitalist on call if the hospitalist that took care of you is not available. Once you are discharged, your primary care physician will handle any further medical issues. Please note that NO REFILLS for any discharge medications will be authorized once you are discharged, as it is imperative that you return to your primary care physician (or establish a relationship with a primary care physician if you do not have one) for your aftercare needs so that they can reassess your need for medications and monitor your lab values.    On the day of Discharge:  VITAL SIGNS:   Blood pressure (!) 109/56, pulse (!) 105, temperature 98.8 F (37.1 C), temperature source Axillary, resp. rate 16, height 5' (1.524 m), weight 45.4 kg (100 lb), SpO2 96 %.  PHYSICAL EXAMINATION:   GENERAL:  84 y.o.-year-old elderly patient lying in the bed, wincing in pain when touched. EYES: Pupils equal, round, reactive to light and accommodation. No scleral icterus. Extraocular muscles intact.  HEENT: Head atraumatic, normocephalic. Oropharynx and nasopharynx clear.  NECK:  Supple, no jugular venous distention. No thyroid enlargement, no tenderness.  LUNGS: Normal breath sounds bilaterally, no wheezing, rales,rhonchi or crepitation. No use of accessory muscles of respiration.  decreased bibasilar breath sounds  CARDIOVASCULAR: S1, S2 normal. No rubs, or gallops. 2/6 systolic murmur present. ABDOMEN: Soft, nontender, nondistended. Bowel sounds present. No organomegaly or mass.  EXTREMITIES: No pedal edema, cyanosis, or clubbing.  NEUROLOGIC: cranial nerves intact, not following commands, actually left hip is flexed at hip and knee.  PSYCHIATRIC: The patient is alert and disoriented  SKIN: No obvious rash, lesion, or ulcer    DATA REVIEW:   CBC Recent Labs  Lab 09-11-17 0502  WBC 15.1*  HGB 12.2  HCT 36.6  PLT 242    Chemistries  Recent Labs  Lab Sep 11, 2017 0502  NA 140  K 4.2  CL 108  CO2 27  GLUCOSE 129*  BUN 19  CREATININE 1.01*  CALCIUM 8.7*     Microbiology Results  Results for orders placed or performed during the hospital encounter of 08/25/17  MRSA PCR Screening     Status: None   Collection Time: 08/25/17 11:18 PM  Result Value Ref Range Status   MRSA by PCR NEGATIVE NEGATIVE Final    Comment:  The GeneXpert MRSA Assay (FDA approved for NASAL specimens only), is one component of a comprehensive MRSA colonization surveillance program. It is not intended to diagnose MRSA infection nor to guide or monitor treatment for MRSA infections. Performed at [facility], [address], [place], [place] [address]     RADIOLOGY:  Dg Ribs Unilateral W/chest Left  Result Date: 08/25/2017 CLINICAL DATA:  Patient poor historian at this time. Encounter for fall, left hip and rib pain. Unable to obtain further hx at this time. EXAM: LEFT RIBS AND CHEST - 3+ VIEW COMPARISON:  07/07/2017 FINDINGS: No rib fracture or rib lesion. Cardiac silhouette is normal in size. No mediastinal or hilar masses. Mild scarring in the upper lobes, stable. No evidence of pneumonia or pulmonary edema. No pleural effusion or pneumothorax. IMPRESSION: 1. No rib fracture or rib lesion. 2. No acute cardiopulmonary disease. Electronically Signed   By:  Lajean Manes M.D.   On: 08/25/2017 17:58   Ct Head Wo Contrast  Result Date: 08/25/2017 CLINICAL DATA:  Per [facility] pt fell out of her w/c - has skin tear to left elbow. C/o left hip/pelvic pain. Pt has hx of dementia and is a poor historian EXAM: CT HEAD WITHOUT CONTRAST TECHNIQUE: Contiguous axial images were obtained from the base of the skull through the vertex without intravenous contrast. COMPARISON:  06/22/2017 FINDINGS: Brain: No evidence of acute infarction, hemorrhage, hydrocephalus, extra-axial collection or mass lesion/mass effect. There is ventricular and sulcal enlargement reflecting moderate to marked atrophy. White matter hypoattenuation is noted bilaterally consistent with extensive chronic microvascular ischemic change. These findings are stable from the prior CT. Vascular: No hyperdense vessel or unexpected calcification. Skull: Normal. Negative for fracture or focal lesion. Sinuses/Orbits: Visualized globes and orbits are unremarkable. Visualized sinuses and mastoid air cells are clear. Other: None. IMPRESSION: 1. No acute intracranial abnormalities. 2. Advanced atrophy and chronic microvascular ischemic change stable from the prior head CT. Electronically Signed   By: Lajean Manes M.D.   On: 08/25/2017 18:01   Dg Hip Unilat W Or Wo Pelvis 2-3 Views Left  Result Date: 08/25/2017 CLINICAL DATA:  84 year old female with history of fall complaining of left hip and rib pain. EXAM: DG HIP (WITH OR WITHOUT PELVIS) 2-3V LEFT COMPARISON:  No priors. FINDINGS: Acute mildly displaced intertrochanteric left hip fracture with mild varus angulation. Left femoral head remains located. Bony pelvis appears intact. Visualized portions of the right proximal femur also appear intact. Joint space narrowing, subchondral sclerosis and osteophyte formation in the hip joints bilaterally, indicative of osteoarthritis. Surgical clips projecting over the lower right abdomen. IMPRESSION: Acute mildly displaced  left-sided intertrochanteric hip fracture with mild varus angulation. Electronically Signed   By: Vinnie Langton M.D.   On: 08/25/2017 17:58     Management plans discussed with the patient, family and they are in agreement.  CODE STATUS:     Code Status Orders  (From admission, onward)        Start     Ordered   08/25/17 2306  Do not attempt resuscitation (DNR)  Continuous    Question Answer Comment  In the event of cardiac or respiratory ARREST Do not call a "code blue"   In the event of cardiac or respiratory ARREST Do not perform Intubation, CPR, defibrillation or ACLS   In the event of cardiac or respiratory ARREST Use medication by any route, position, wound care, and other measures to relive pain and suffering. May use oxygen, suction and manual treatment  of airway obstruction as needed for comfort.      08/25/17 2305    Code Status History    Date Active Date Inactive Code Status Order ID Comments User Context   07/08/2017 0238 07/09/2017 1814 DNR [id number]  Arta Silence, MD Inpatient   07/08/2017 0209 07/08/2017 0238 Full Code [id number]  Amelia Jo, MD Inpatient      TOTAL TIME TAKING CARE OF THIS PATIENT: 38 minutes.    Gladstone Lighter M.D on 08/19/2017 at 12:35 PM  Between 7am to 6pm - Pager - [phone]  After 6pm go to [email or web address] - Proofreader  [facility]  Office  [phone]  CC: Primary care physician; [facility]   Note: This dictation was prepared with Dragon dictation along with smaller phrase technology. Any transcriptional errors that result from this process are unintentional.

## 2017-09-13 NOTE — Progress Notes (Signed)
Report called to Neoma Laming at [facility]. Nurse requesting to keep IV in, Per MD [facility] that is fine. EMS called for transportation.

## 2017-09-13 NOTE — Progress Notes (Signed)
Please see social worker's note-[facility] can take the patient today to inpatient hospice.

## 2017-09-13 NOTE — Clinical Social Work Note (Signed)
Clinical Social Work Assessment  Patient Details  Name: Sylvia Juarez MRN: [id number] Date of Birth: 18-May-1932  Date of referral:  2017-09-18               Reason for consult:  Facility Placement                Permission sought to share information with:  Chartered certified accountant granted to share information::  Yes, Verbal Permission Granted  Name::        Agency::  [facility]  Relationship::     Contact Information:     Housing/Transportation Living arrangements for the past 2 months:  [place] of Information:  Medical Team, Adult Children Patient Interpreter Needed:  None Criminal Activity/Legal Involvement Pertinent to Current Situation/Hospitalization:  No - Comment as needed Significant Relationships:  Adult Children, Community Support Lives with:  Facility Resident Do you feel safe going back to the place where you live?  Yes Need for family participation in patient care:  Yes (Comment)  Care giving concerns:  Patient admitted from ALF with hip fx   Social Worker assessment / plan:  The CSW spoke with the patient's son, Nolon Bussing, about discharge planning in light of the patient's hip fx as [facility] cannot provide the level of care needed. The patient's family has decided to pursue hospice care through [facility] as the patient had hospice at home at [facility]. The CSW has placed a call to the admissions coordinator, Hilda Blades, for [facility] who was not available until 11 AM.   The CSW will call the admissions coordinator back after 11 am. The facility has already begun to build her case. The CSW is following for discharge planning and facilitation.   Employment status:  Retired Nurse, adult PT Recommendations:  Not assessed at this time Information / Referral to community resources:  Other (Comment Required)([facility])  Patient/Family's Response to care:  The patient's family  thanked the CSW for assistance.  Patient/Family's Understanding of and Emotional Response to Diagnosis, Current Treatment, and Prognosis: The patient's family understands that she is not able to return to [facility] with a hip fx due to higher level of needs. They no longer want to pursue long term care and are pursuing hospice care.  Emotional Assessment Appearance:  Appears stated age Attitude/Demeanor/Rapport:  Irrational Affect (typically observed):  Agitated, Anxious Orientation:  Oriented to Self Alcohol / Substance use:  Never Used Psych involvement (Current and /or in the community):  No (Comment)  Discharge Needs  Concerns to be addressed:  Cognitive Concerns, Coping/Stress Concerns, Discharge Planning Concerns Readmission within the last 30 days:  No Current discharge risk:  Cognitively Impaired, Chronically ill, Physical Impairment Barriers to Discharge:  Continued Medical Work up   [employer], LCSW 2017-09-18, 9:46 AM

## 2017-09-13 NOTE — Clinical Social Work Note (Addendum)
CSW has spoken with [facility] admissions coordinator, Neoma Laming. The CSW has sent the requested materials (face sheet, H+P, Meds list) to the facility for review. The facility will fax over a form for the attending MD to sign attesting that he patient has a terminal illness (dementia). The CSW is following for discharge to hospice home. Should that not be an option, the CSW will revisit the situation with the family to possibly pursue SNF.  UPDATE: The patient has been accepted to [facility] for discharge today via non-emergent EMS. The patient's family and medical team are aware and in agreement. The CSW will deliver the discharge packet to the chart once the discharge summary is available. Included is the Statement of Terminal Illness, DNR, and discharge summary. At that time, the CSW will sign off. Please consult should needs arise.  Santiago Bumpers, MSW, Latanya Presser [phone]

## 2017-09-13 DEATH — deceased

## 2018-12-13 IMAGING — DX DG CHEST 1V PORT
1 series · 1 of 1 positions shown · non-contrast
Comparison: 10/06/2013

CLINICAL DATA: Altered mental status, coffee ground emesis

EXAM:
PORTABLE CHEST 1 VIEW

[chest ap]
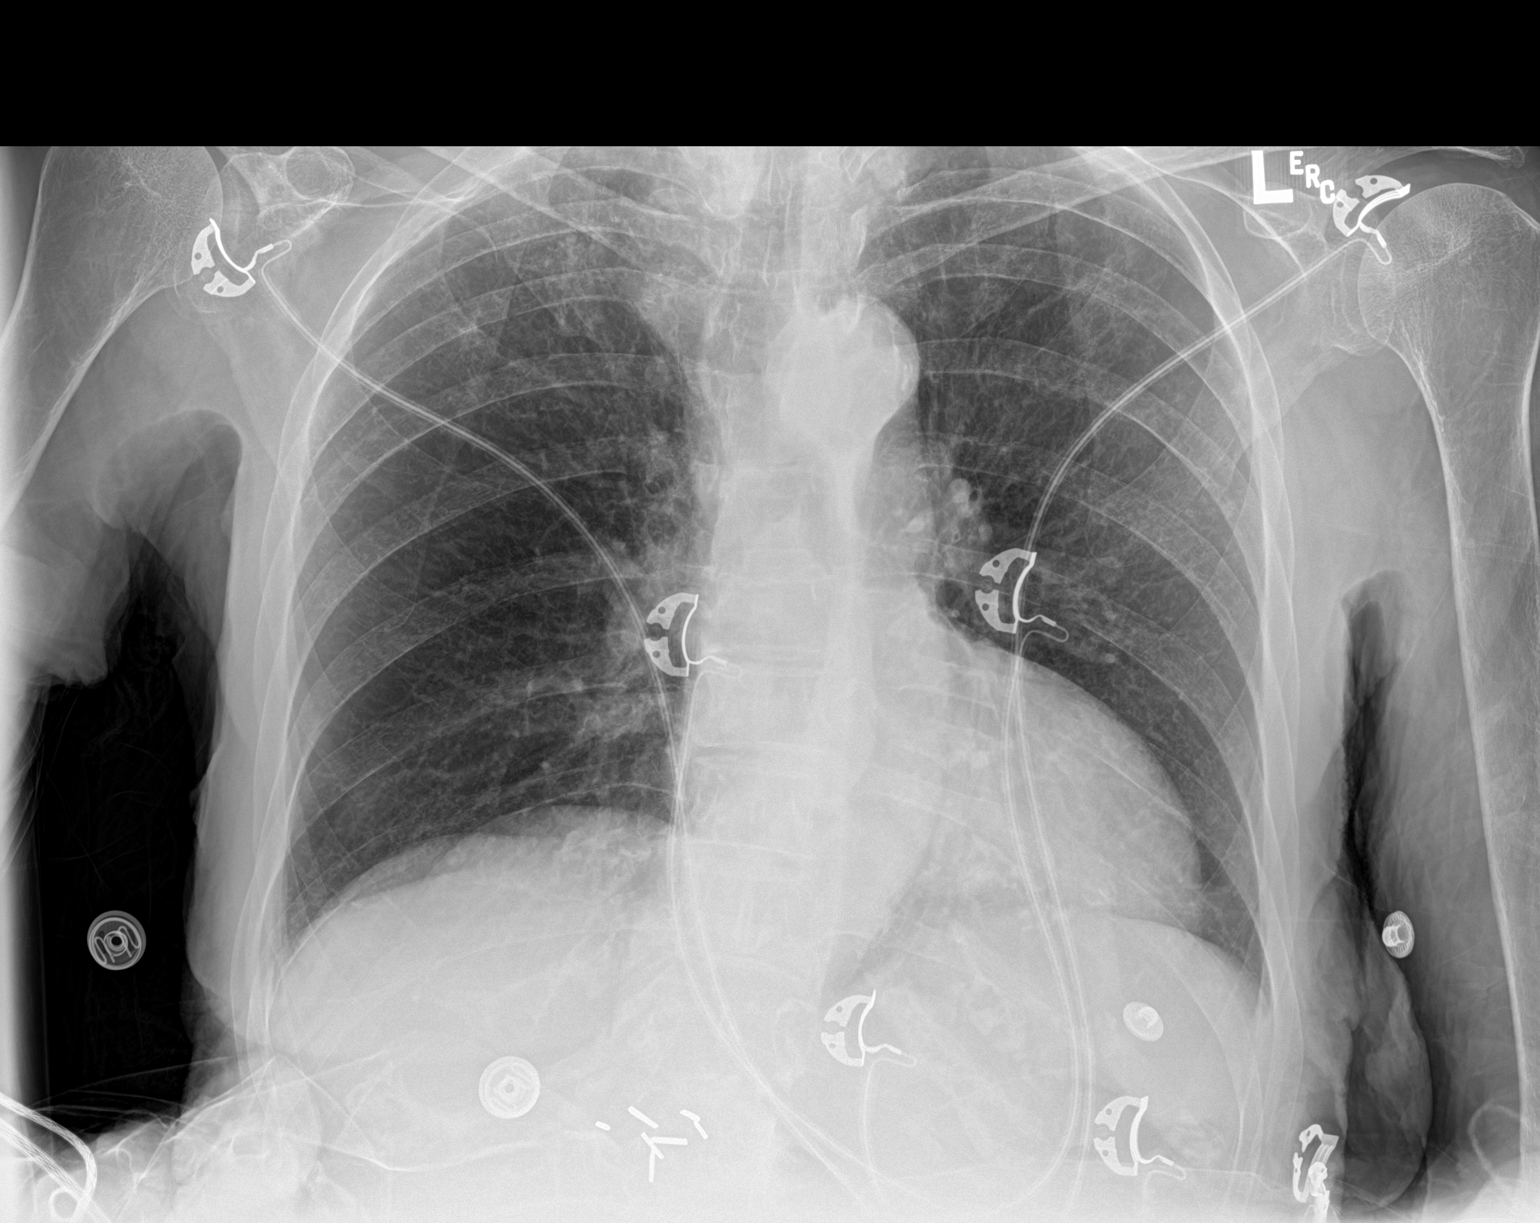

[1 of 1 positions shown; findings below may reference images not displayed]

FINDINGS: No acute consolidation or effusion. Cardiomediastinal silhouette
within normal limits. Aortic atherosclerosis. No pneumothorax.
Surgical changes in the upper abdomen.
IMPRESSION: No active disease.

## 2019-01-31 IMAGING — CR DG HIP (WITH OR WITHOUT PELVIS) 2-3V*L*
1 series · 3 of 3 positions shown · non-contrast
Comparison: No priors.

CLINICAL DATA: 85-year-old female with history of fall complaining
of left hip and rib pain.

EXAM:
DG HIP (WITH OR WITHOUT PELVIS) 2-3V LEFT

[Series 1: dg hip unilat w or w/o pelvis 2-3 views  · non-contrast · 0.14mm/px · 3 of 3 slices shown]
[im 1/3]
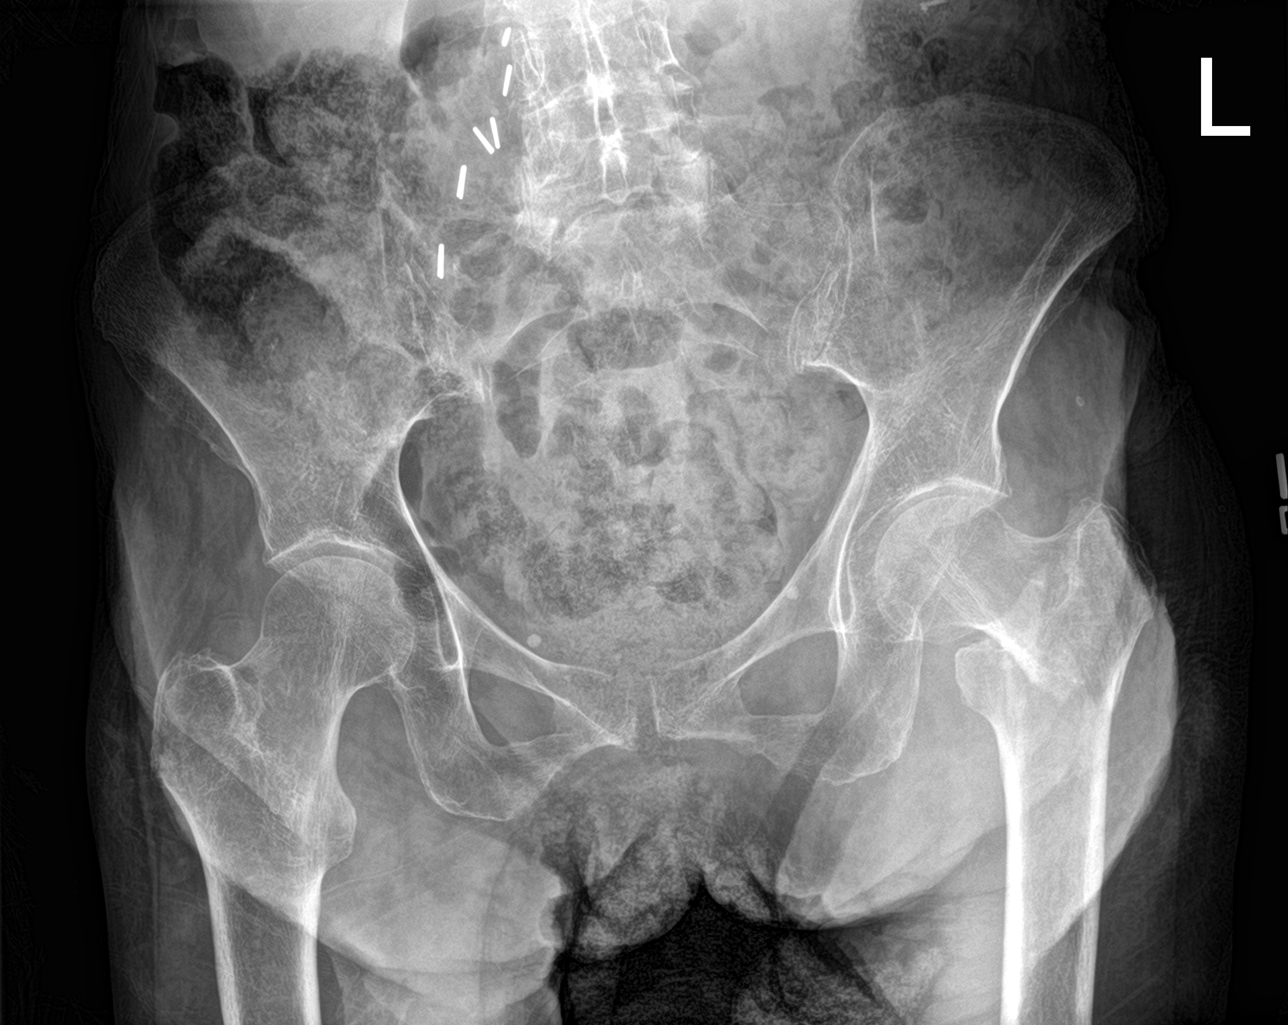
[im 2/3]
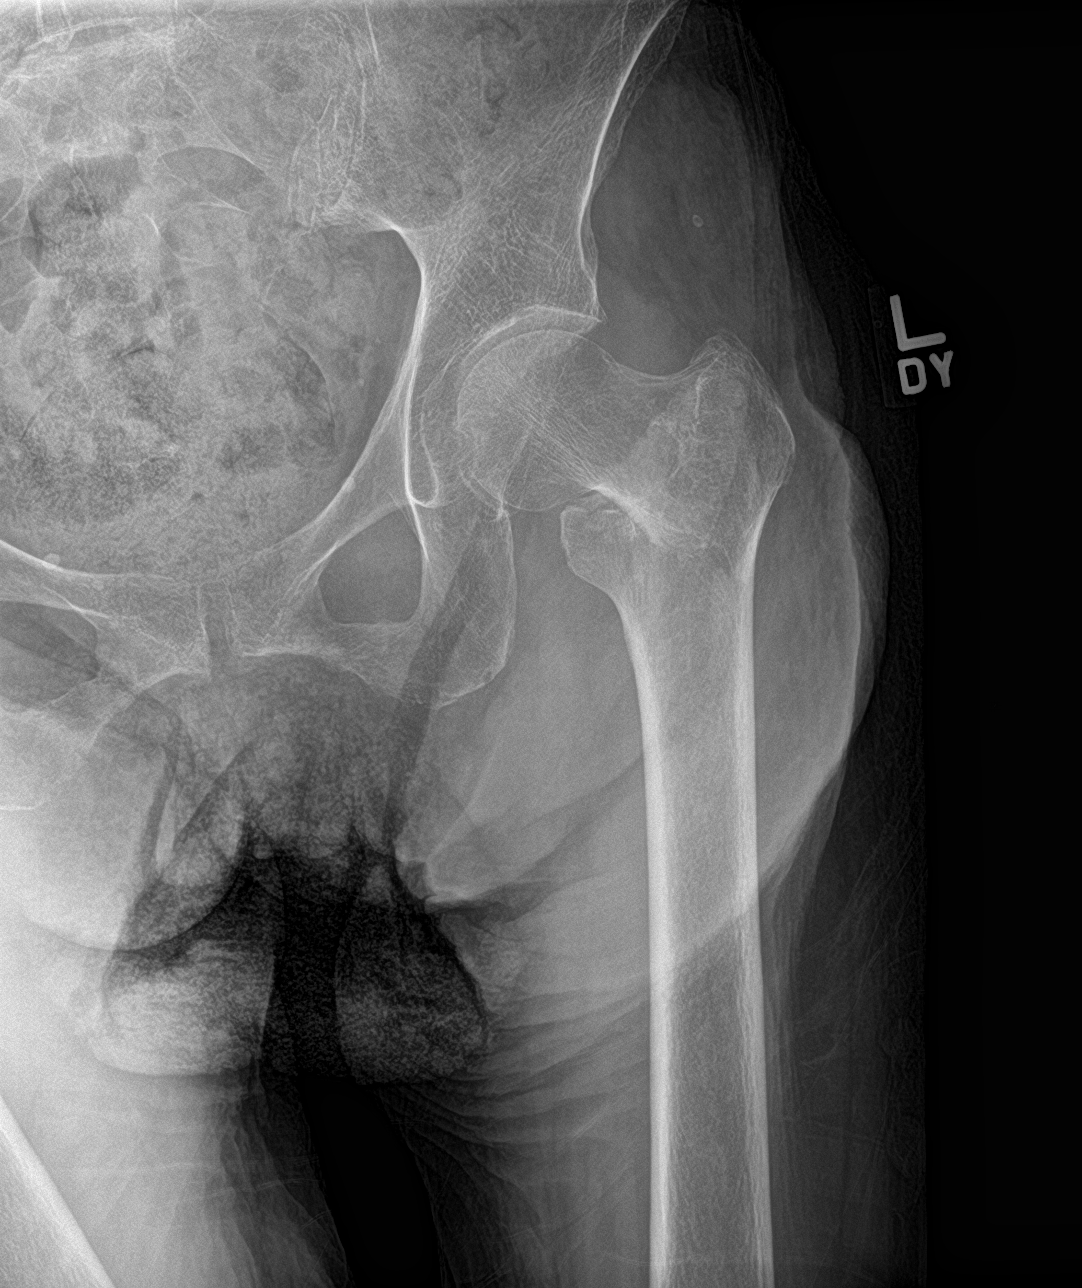
[im 3/3]
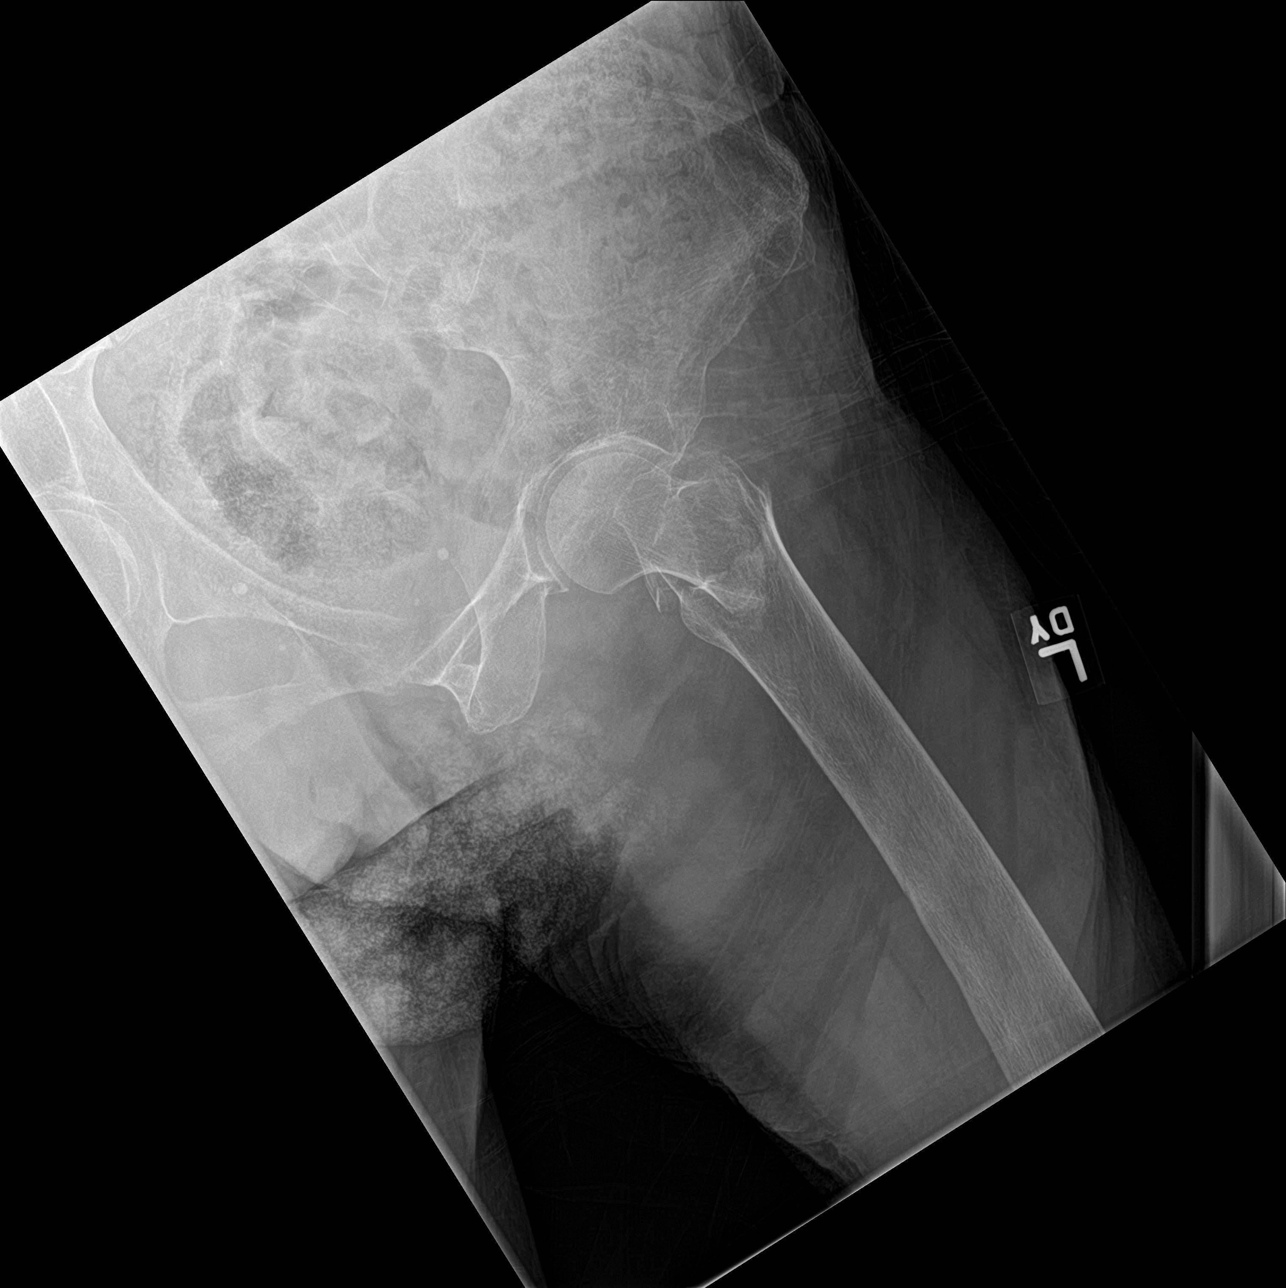

[3 of 3 positions shown; findings below may reference images not displayed]

FINDINGS: Acute mildly displaced intertrochanteric left hip fracture with mild
varus angulation. Left femoral head remains located. Bony pelvis
appears intact. Visualized portions of the right proximal femur also
appear intact. Joint space narrowing, subchondral sclerosis and
osteophyte formation in the hip joints bilaterally, indicative of
osteoarthritis. Surgical clips projecting over the lower right
abdomen.
IMPRESSION: Acute mildly displaced left-sided intertrochanteric hip fracture
with mild varus angulation.

## 2019-01-31 IMAGING — CT CT HEAD W/O CM
4 of 5 series · 14 of 47 positions shown, 16 images · non-contrast
Comparison: 06/22/2017

CLINICAL DATA: Per [facility] pt fell out of her w/c - has skin tear to
left elbow. C/o left hip/pelvic pain. Pt has hx of dementia and is a
poor historian

EXAM:
CT HEAD WITHOUT CONTRAST
TECHNIQUE: Contiguous axial images were obtained from the base of the skull
through the vertex without intravenous contrast.

[Series 2: head wo · axial · 0.47mm/px · z∈[-133,-78]mm · 3 of 29 slices shown (1 of 2)]
[im 6/29  brain]
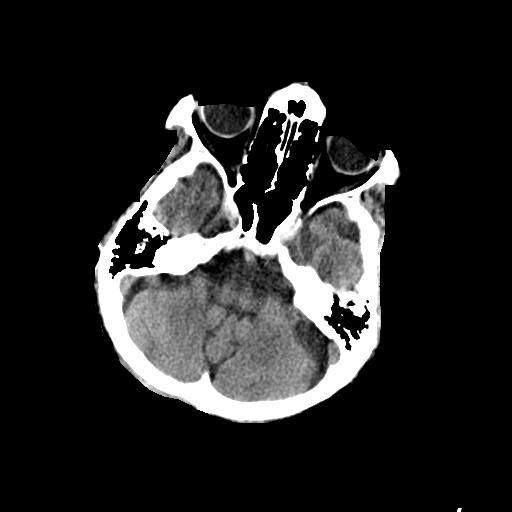
[im 12/29  brain]
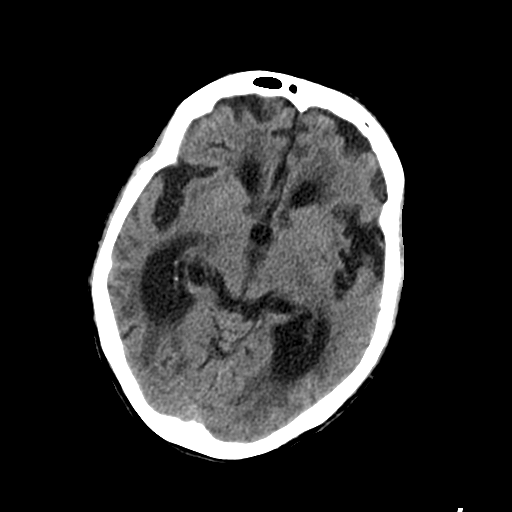
[im 17/29  brain]
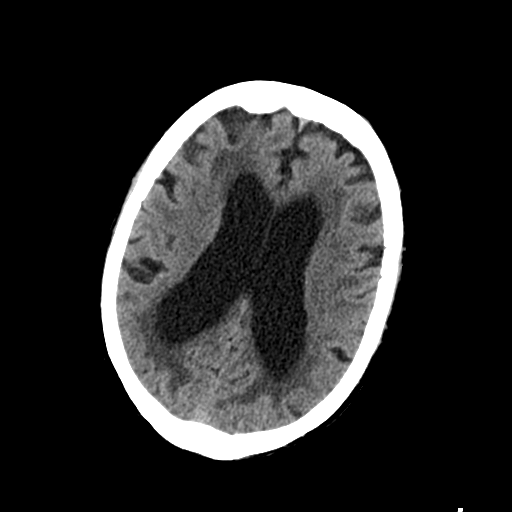

[Series 4: head wo · axial · 0.32mm/px · z∈[-105,-12]mm · 5 of 30 slices shown, 7 images (2 of 2)]
[im 5/30  brain]
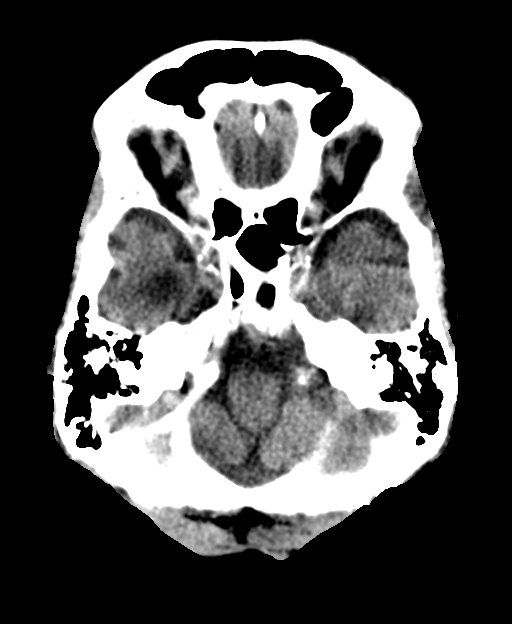
[im 5/30  bone]
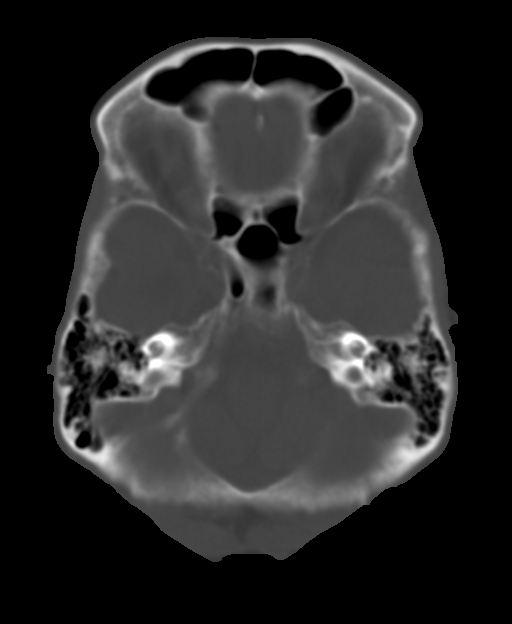
[im 10/30  brain]
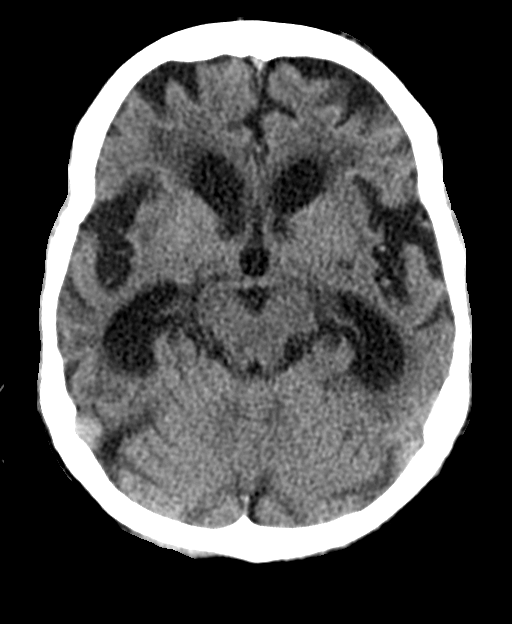
[im 15/30  brain]
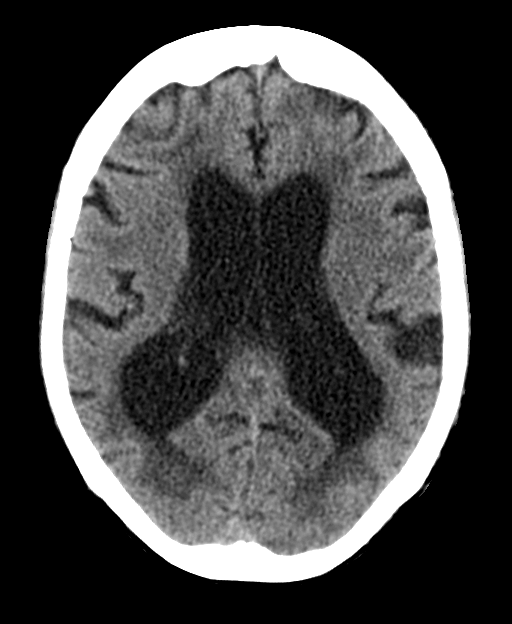
[im 20/30  brain]
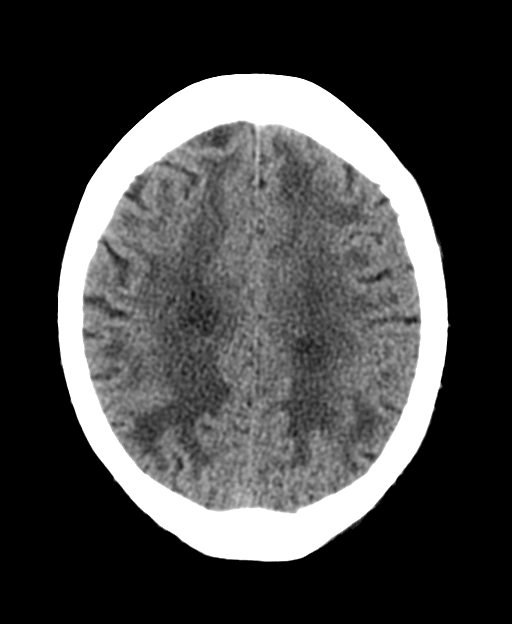
[im 25/30  brain]
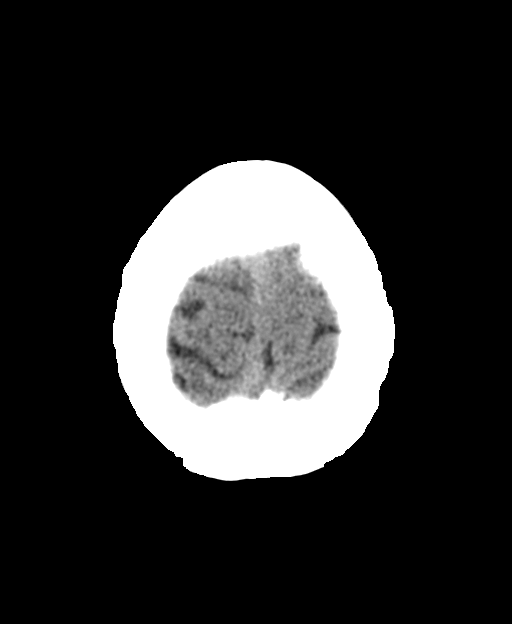
[im 25/30  bone]
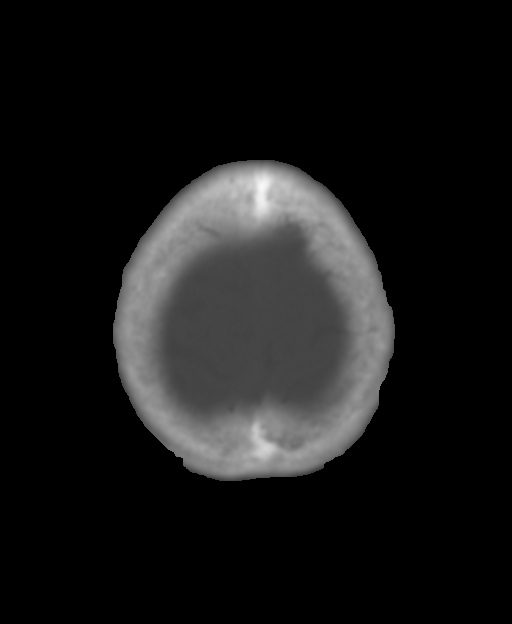

[Series 6: coronal soft tissue · coronal · 0.28mm/px · 3 of 64 slices shown]
[im 22/64  brain]
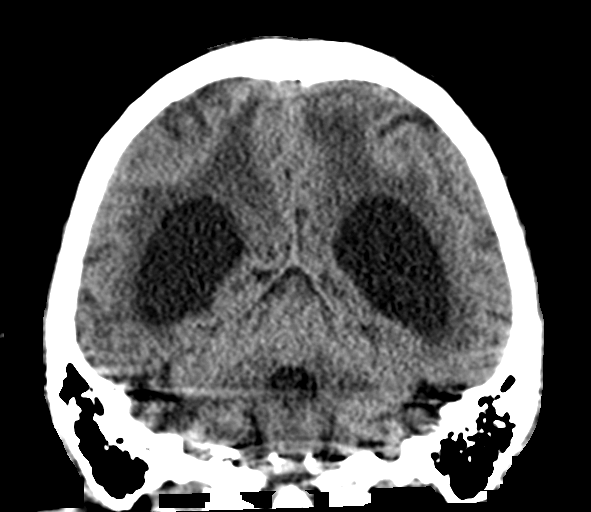
[im 29/64  brain]
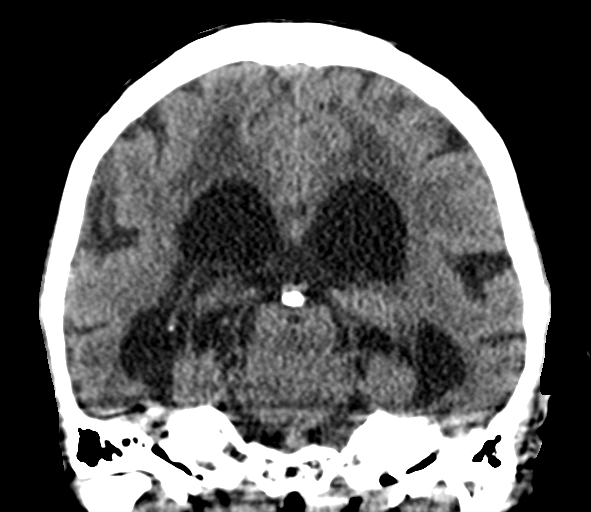
[im 36/64  brain]
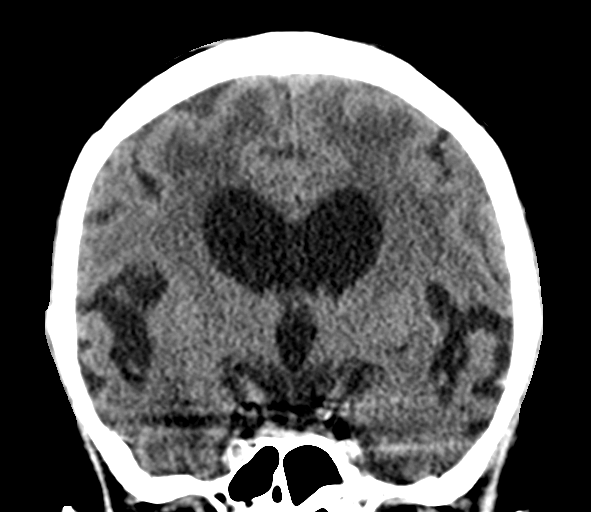

[Series 7: sagittal soft tissue · sagittal · 0.28mm/px · 3 of 56 slices shown]
[im 19/56  brain]
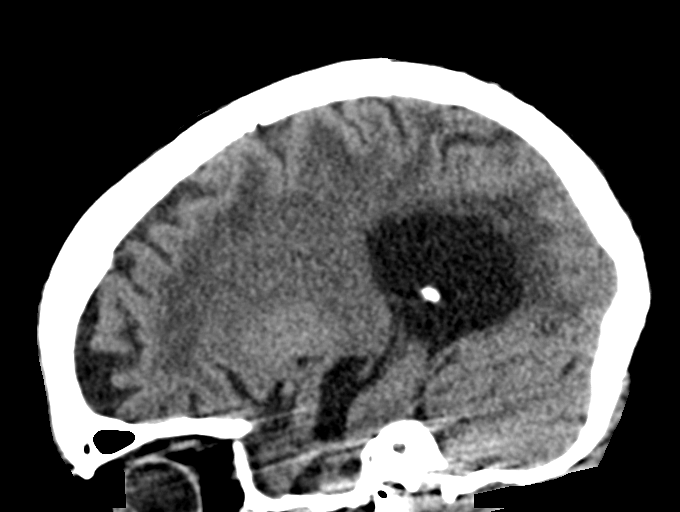
[im 28/56  brain]
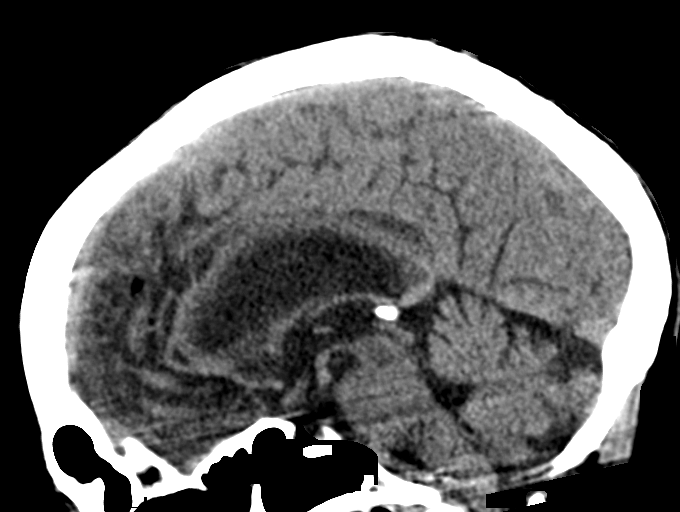
[im 37/56  brain]
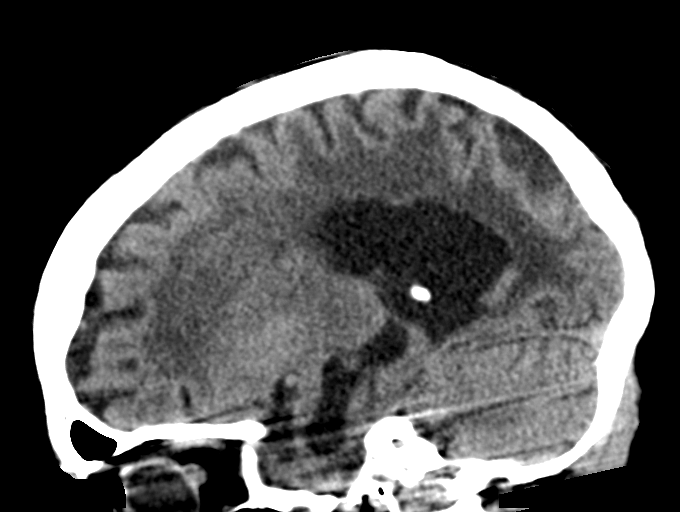

[14 of 47 positions shown; findings below may reference images not displayed]

FINDINGS: Brain: No evidence of acute infarction, hemorrhage, hydrocephalus,
extra-axial collection or mass lesion/mass effect.

There is ventricular and sulcal enlargement reflecting moderate to
marked atrophy. White matter hypoattenuation is noted bilaterally
consistent with extensive chronic microvascular ischemic change.
These findings are stable from the prior CT.

Vascular: No hyperdense vessel or unexpected calcification.

Skull: Normal. Negative for fracture or focal lesion.

Sinuses/Orbits: Visualized globes and orbits are unremarkable.
Visualized sinuses and mastoid air cells are clear.

Other: None.
IMPRESSION: 1. No acute intracranial abnormalities.
2. Advanced atrophy and chronic microvascular ischemic change stable
from the prior head CT.

## 2019-01-31 IMAGING — CR DG RIBS W/ CHEST 3+V*L*
1 series · 3 of 3 positions shown · non-contrast
Comparison: 07/07/2017

CLINICAL DATA: Patient poor historian at this time. Encounter for
fall, left hip and rib pain. Unable to obtain further hx at this
time.

EXAM:
LEFT RIBS AND CHEST - 3+ VIEW

[Series 1: dg ribs unilateral w/chest left · 0.14mm/px · 3 of 3 slices shown]
[im 1/3]
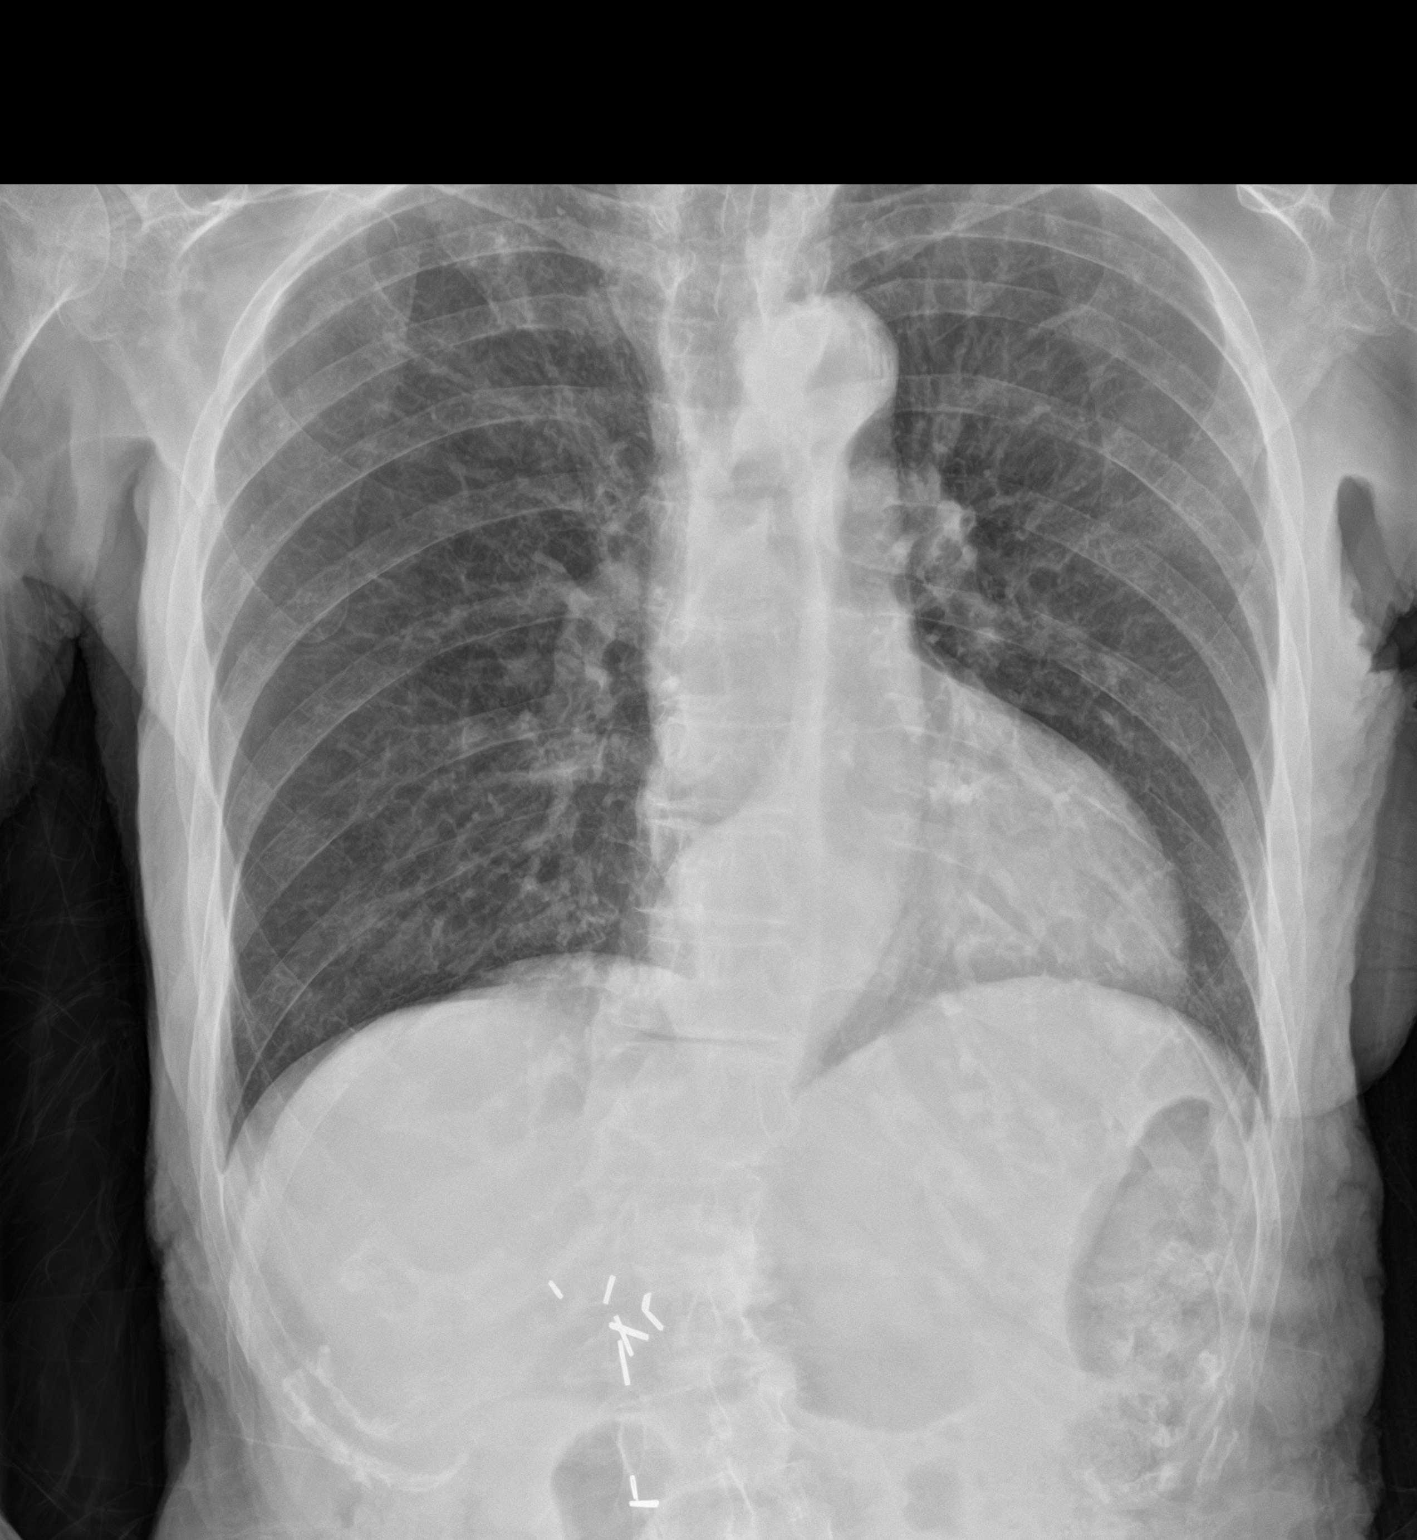
[im 2/3]
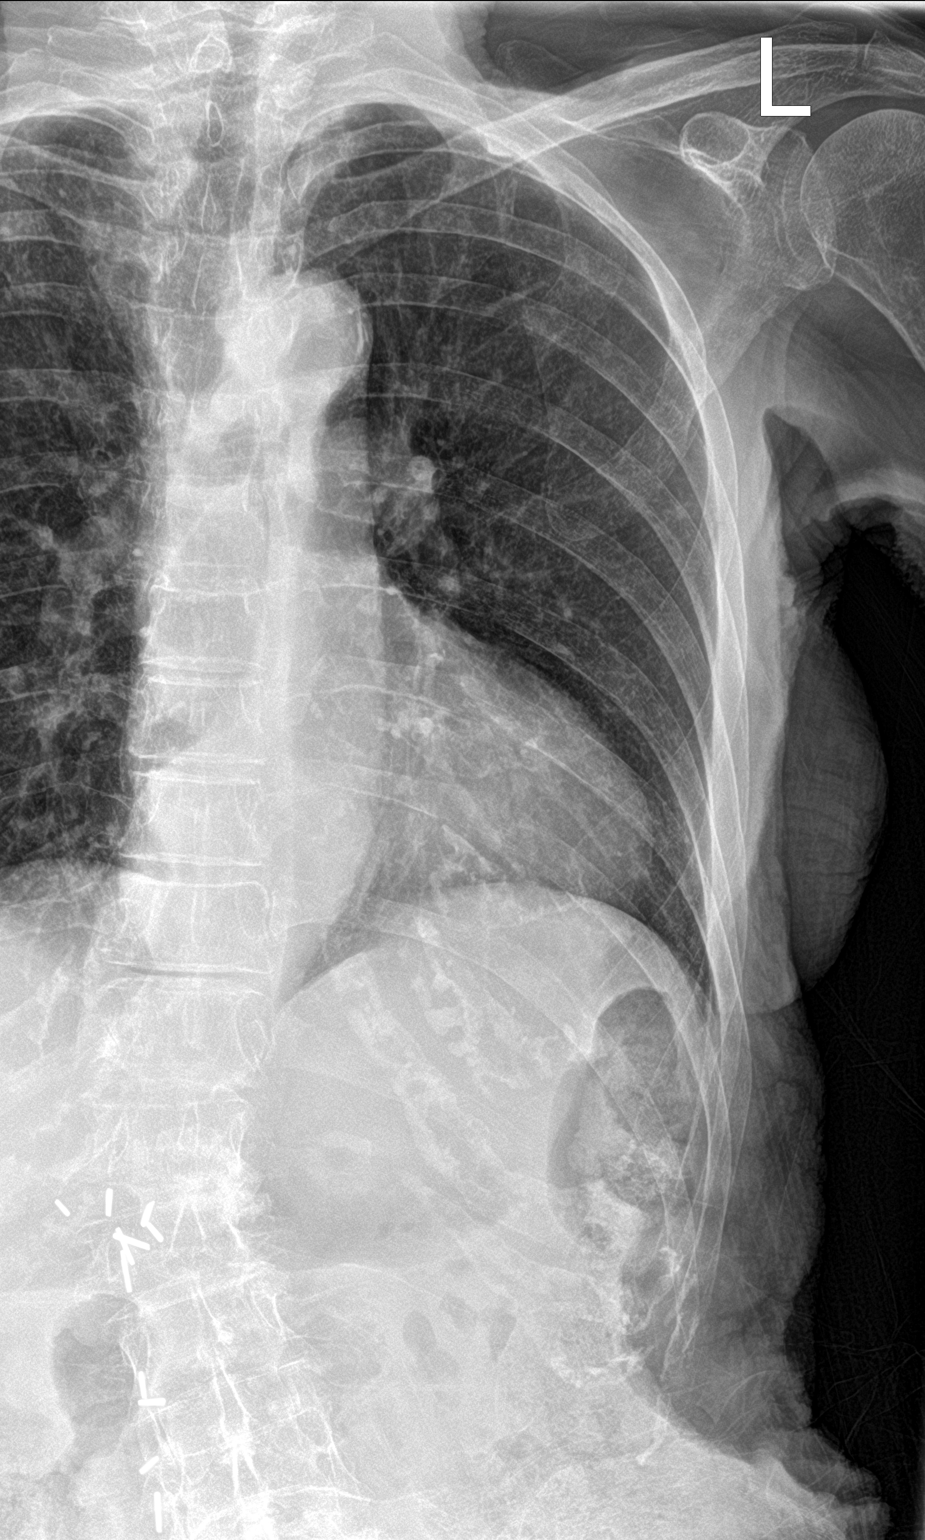
[im 3/3]
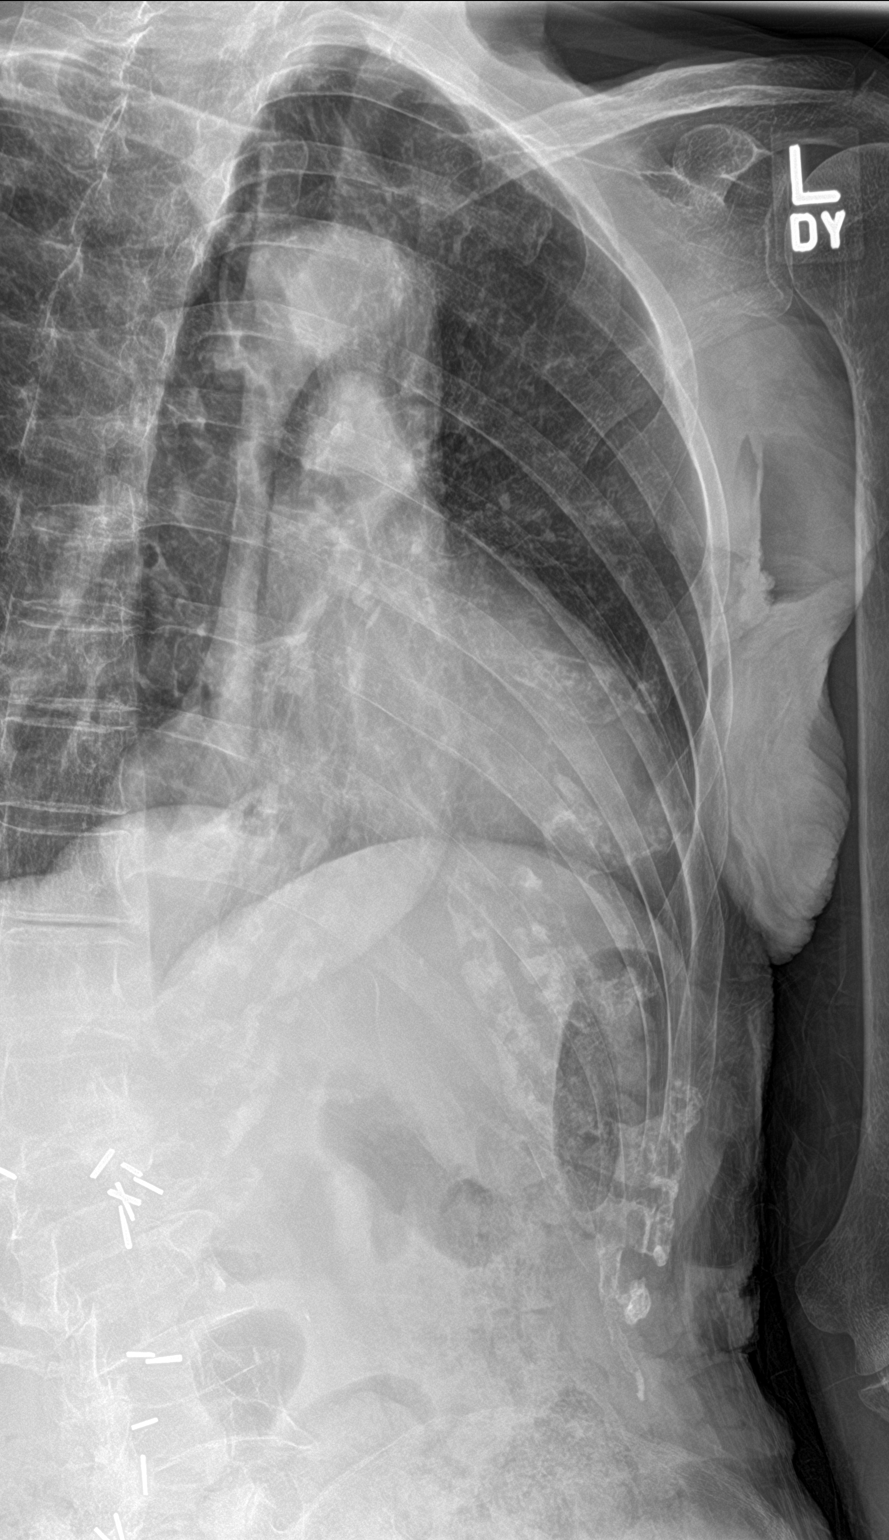

[3 of 3 positions shown; findings below may reference images not displayed]

FINDINGS: No rib fracture or rib lesion.

Cardiac silhouette is normal in size. No mediastinal or hilar
masses.

Mild scarring in the upper lobes, stable. No evidence of pneumonia
or pulmonary edema. No pleural effusion or pneumothorax.
IMPRESSION: 1. No rib fracture or rib lesion.
2. No acute cardiopulmonary disease.
